# Patient Record
Sex: Female | Born: 1960 | Race: Black or African American | Hispanic: No | Marital: Married | Smoking: Never smoker
Health system: Southern US, Community
[De-identification: ages and names within clinical notes are randomized; demographics above are authoritative.]

## PROBLEM LIST (undated history)

## (undated) DIAGNOSIS — E079 Disorder of thyroid, unspecified: Secondary | ICD-10-CM

## (undated) DIAGNOSIS — C50919 Malignant neoplasm of unspecified site of unspecified female breast: Secondary | ICD-10-CM

## (undated) DIAGNOSIS — E119 Type 2 diabetes mellitus without complications: Secondary | ICD-10-CM

## (undated) DIAGNOSIS — I10 Essential (primary) hypertension: Secondary | ICD-10-CM

## (undated) HISTORY — DX: Type 2 diabetes mellitus without complications: E11.9

## (undated) HISTORY — PX: THYROIDECTOMY, PARTIAL: SHX18

## (undated) HISTORY — PX: TUBAL LIGATION: SHX77

## (undated) HISTORY — DX: Malignant neoplasm of unspecified site of unspecified female breast: C50.919

## (undated) HISTORY — DX: Disorder of thyroid, unspecified: E07.9

## (undated) HISTORY — PX: APPENDECTOMY: SHX54

## (undated) HISTORY — DX: Essential (primary) hypertension: I10

---

## 2009-11-25 ENCOUNTER — Ambulatory Visit (HOSPITAL_COMMUNITY): Admission: RE | Admit: 2009-11-25 | Discharge: 2009-11-25 | Payer: Self-pay | Admitting: Internal Medicine

## 2010-03-10 ENCOUNTER — Encounter (HOSPITAL_COMMUNITY): Admission: RE | Admit: 2010-03-10 | Discharge: 2010-03-10 | Payer: Self-pay | Admitting: Internal Medicine

## 2012-04-06 ENCOUNTER — Ambulatory Visit (INDEPENDENT_AMBULATORY_CARE_PROVIDER_SITE_OTHER): Payer: BC Managed Care – PPO | Admitting: Family Medicine

## 2012-04-06 VITALS — BP 155/96 | HR 81 | Temp 98.2°F | Resp 18 | Ht 66.5 in | Wt 264.8 lb

## 2012-04-06 DIAGNOSIS — L0291 Cutaneous abscess, unspecified: Secondary | ICD-10-CM

## 2012-04-06 DIAGNOSIS — L0390 Cellulitis, unspecified: Secondary | ICD-10-CM

## 2012-04-06 DIAGNOSIS — I10 Essential (primary) hypertension: Secondary | ICD-10-CM

## 2012-04-06 MED ORDER — FLUCONAZOLE 150 MG PO TABS: 150.0000 mg | ORAL_TABLET | Freq: Once | ORAL | Status: AC

## 2012-04-06 MED ORDER — CEPHALEXIN 500 MG PO CAPS: 1000.0000 mg | ORAL_CAPSULE | Freq: Two times a day (BID) | ORAL | Status: AC

## 2012-04-06 MED ORDER — LISINOPRIL 10 MG PO TABS: 10.0000 mg | ORAL_TABLET | Freq: Every day | ORAL | Status: DC

## 2012-04-06 NOTE — Progress Notes (Signed)
  Patient Name: Frances Cunningham Date of Birth: [birth year] Medical Record Number: [id number] Gender: female Date of Encounter: 04/06/2012  History of Present Illness:  Frances Cunningham is a 51 y.o. very pleasant female patient who presents with the following:  She had noted some left ear canal pain and swelling for several months This morning is worse- her hearing is decreased and swelling is increased.  She also felt some sort of fluid coming from her ear which concerned her.  No ST, no nasal symptoms, fever or cough  She also has been off of her lisinopril for about 6 months Her home BP is usually about 124/84 at home even without her medication.  However, her readings at clinic are consistently in the hypertensive range There is no problem list on file for this patient.  No past medical history on file. No past surgical history on file. History  Substance Use Topics  . Smoking status: Never Smoker   . Smokeless tobacco: Not on file  . Alcohol Use: Not on file   No family history on file. No Known Allergies  Medication list has been reviewed and updated.  Review of Systems: As per HPI- otherwise negative.   Physical Examination: Filed Vitals:   04/06/12 1029  BP: 155/96  Pulse: 81  Temp: 98.2 F (36.8 C)  TempSrc: Oral  Resp: 18  Height: 5' 6.5" (1.689 m)  Weight: 264 lb 12.8 oz (120.112 kg)  SpO2: 98%    Body mass index is 42.10 kg/(m^2).  GEN: WDWN, NAD, Non-toxic, A & O x 3, obese HEENT: Atraumatic, Normocephalic. Neck supple. No masses, No LAD.  Oropharynx wnl Ears and Nose: No external deformity. CV: RRR, No M/G/R. No JVD. No thrill. No extra heart sounds. PULM: CTA B, no wheezes, crackles, rhonchi. No retractions. No resp. distress. No accessory muscle use. EXTR: No c/c/e NEURO Normal gait.  PSYCH: Normally interactive. Conversant. Not depressed or anxious appearing.  Calm demeanor.  Right ear/ tm wnl.  Left ear: there is a pustule vs ruptured sebaceus cyst just  visible inside the ear canal.  It is already draining purulent matter.  Used a Q-tip and my finger to finish draining the area- got quite a bit of material out. TM and the remained of the IAC are normal   Assessment and Plan: 1. Abscess  cephALEXin (KEFLEX) 500 MG capsule, fluconazole (DIFLUCAN) 150 MG tablet  2. HTN (hypertension)  lisinopril (PRINIVIL,ZESTRIL) 10 MG tablet   Skin/ soft tissue infection inside left ear canal, pustule vs sebaceous.  Drained as above, use hot compresses to continue drainage.  Keflex as above Restart antihypertensives as I suspect her BP is elevated quite a bit of the time.  Patient (or parent if minor) instructed to return to clinic or call if not better in 2 day(s).  Sooner if worse.     She agreed to come in for a CPE this summer

## 2013-04-04 LAB — LIPID PANEL
Cholesterol: 198 mg/dL (ref 0–200)
HDL: 69 mg/dL (ref 35–70)
LDL Cholesterol: 111 mg/dL

## 2013-04-09 ENCOUNTER — Ambulatory Visit (INDEPENDENT_AMBULATORY_CARE_PROVIDER_SITE_OTHER): Payer: BC Managed Care – PPO | Admitting: Family Medicine

## 2013-04-09 VITALS — BP 128/80 | HR 74 | Temp 98.7°F | Resp 12 | Ht 67.5 in | Wt 266.0 lb

## 2013-04-09 DIAGNOSIS — E039 Hypothyroidism, unspecified: Secondary | ICD-10-CM

## 2013-04-09 DIAGNOSIS — N912 Amenorrhea, unspecified: Secondary | ICD-10-CM

## 2013-04-09 DIAGNOSIS — I10 Essential (primary) hypertension: Secondary | ICD-10-CM | POA: Insufficient documentation

## 2013-04-09 DIAGNOSIS — Z0000 Encounter for general adult medical examination without abnormal findings: Secondary | ICD-10-CM

## 2013-04-09 LAB — COMPREHENSIVE METABOLIC PANEL
ALT: 15 U/L (ref 0–35)
Albumin: 4.1 g/dL (ref 3.5–5.2)
Alkaline Phosphatase: 84 U/L (ref 39–117)
CO2: 24 mEq/L (ref 19–32)
Chloride: 108 mEq/L (ref 96–112)
Creat: 1.08 mg/dL (ref 0.50–1.10)
Potassium: 4 mEq/L (ref 3.5–5.3)
Total Bilirubin: 0.6 mg/dL (ref 0.3–1.2)

## 2013-04-09 LAB — POCT GLYCOSYLATED HEMOGLOBIN (HGB A1C): Hemoglobin A1C: 6

## 2013-04-09 MED ORDER — LISINOPRIL 10 MG PO TABS: 10.0000 mg | ORAL_TABLET | Freq: Every day | ORAL | Status: DC

## 2013-04-09 NOTE — Patient Instructions (Addendum)
You do need to schedule a mammogram as soon as possible- please let me know if you have any difficulty with this.    You do have pre- diabetes on the basis of today's A1c test- the cut- off for diagnosis of diabetes is an A1c of 6.5%.    Please work on diet and weight loss, and let's plan to recheck in about 3 months.   Please refer to the ADA website for lots of good diet/ exercise tips.    It is time to schedule a colonoscopy.  Please do this at your convenience.   You can use [facility], [facility] or [facility] (office of Dr. Elnoria Howard [address]).

## 2013-04-09 NOTE — Progress Notes (Signed)
[facility] [address], [place] [address] [phone]  Date:  04/09/2013   Name:  Frances Cunningham   DOB:  Sep 21, 1961   MRN:  [id number]  PCP:  No primary provider on file.    Chief Complaint: Gynecologic Exam   History of Present Illness:  Frances Cunningham is a 52 y.o. very pleasant female patient who presents with the following:  Here today for a CPE. History of HTN and hypothyroidism. She sees Dr. Sharl Ma who manages her thyroid condition She would like a pap today- uncertain of the date of her last pap, but no history of abnormal paps. No period since November of 2013.  Believes she is going through menopause.  She is s/p BTL She has not yet had a mammogram Not yet had a colonoscopy- her insurance company has contacted her about this and she does plan to schedule this test.     She had some labs taken at a health fair last week- her A1c was 6.6%.  She was noted to have a total CHL of 198, LDL of 111, HDL of 69.    She is fasting today so far.   She had a tetanus shot performed elsewhere in the last 2 or 3 years.   She did have gestational diabetes mellitus during pregnancy   There are no active problems to display for this patient.   Past Medical History  Diagnosis Date  . Thyroid disease   . Hypertension     Past Surgical History  Procedure Laterality Date  . Appendectomy    . Cesarean section    . Tubal ligation      History  Substance Use Topics  . Smoking status: Never Smoker   . Smokeless tobacco: Not on file  . Alcohol Use: No    No family history on file.  No Known Allergies  Medication list has been reviewed and updated.  Current Outpatient Prescriptions on File Prior to Visit  Medication Sig Dispense Refill  . levothyroxine (SYNTHROID, LEVOTHROID) 112 MCG tablet Take 112 mcg by mouth daily.      Marland Kitchen lisinopril (PRINIVIL,ZESTRIL) 10 MG tablet Take 1 tablet (10 mg total) by mouth daily.  90 tablet  3   No current  facility-administered medications on file prior to visit.    Review of Systems:  As per HPI- otherwise negative.   Physical Examination: Filed Vitals:   04/09/13 1055  BP: 128/80  Pulse: 74  Temp: 98.7 F (37.1 C)  Resp: 12   Filed Vitals:   04/09/13 1055  Height: 5' 7.5" (1.715 m)  Weight: 266 lb (120.657 kg)   Body mass index is 41.02 kg/(m^2). Ideal Body Weight: Weight in (lb) to have BMI = 25: 161.7  GEN: WDWN, NAD, Non-toxic, A & O x 3, obese HEENT: Atraumatic, Normocephalic. Neck supple. No masses, No LAD.  Bilateral TM wnl, oropharynx normal.  PEERL,EOMI.   Ears and Nose: No external deformity. CV: RRR, No M/G/R. No JVD. No thrill. No extra heart sounds. PULM: CTA B, no wheezes, crackles, rhonchi. No retractions. No resp. distress. No accessory muscle use. ABD: S, NT, ND, +BS. No rebound. No HSM. EXTR: No c/c/e NEURO Normal gait.  PSYCH: Normally interactive. Conversant. Not depressed or anxious appearing.  Calm demeanor.  Breast exam; normal, no masses, dimpling or discharge GU: normal exam, no adnexal masses or CMT.     Results for orders placed in visit on 04/09/13  POCT GLYCOSYLATED HEMOGLOBIN (HGB A1C)  Result Value Range   Hemoglobin A1C 6.0       Assessment and Plan: Physical exam, annual - Plan: Comprehensive metabolic panel, POCT glycosylated hemoglobin (Hb A1C), Pap IG w/ reflex to HPV when ASC-U  HTN (hypertension) - Plan: lisinopril (PRINIVIL,ZESTRIL) 10 MG tablet  Morbid obesity  Essential hypertension, benign  Unspecified hypothyroidism  Amenorrhea - Plan: Follicle Stimulating Hormone, hCG, serum, qualitative, CANCELED: POCT urine pregnancy   Repeated her A1c and it was in the pre- diabetes range today.  Discussed this with her in detail.  Recommended that she work on diet and exercise changes, and plan to recheck her A1c in about 3 months.  She is in danger of developing diabetes  Continue lisinopril for BP control.   Will check  FSH to evaluate for menopause.    Will plan further follow- up pending labs.     Signed Abbe Amsterdam, MD

## 2013-04-10 LAB — PAP IG W/ RFLX HPV ASCU

## 2013-04-10 LAB — HCG, SERUM, QUALITATIVE: Preg, Serum: NEGATIVE

## 2013-04-12 ENCOUNTER — Encounter: Payer: Self-pay | Admitting: Family Medicine

## 2013-04-12 ENCOUNTER — Telehealth: Payer: Self-pay | Admitting: Family Medicine

## 2013-04-12 NOTE — Telephone Encounter (Signed)
Have called several times to discuss with her- no answer.  Will send letter

## 2013-06-04 ENCOUNTER — Encounter: Payer: Self-pay | Admitting: *Deleted

## 2014-06-16 ENCOUNTER — Other Ambulatory Visit: Payer: Self-pay | Admitting: Internal Medicine

## 2014-06-16 DIAGNOSIS — R221 Localized swelling, mass and lump, neck: Secondary | ICD-10-CM

## 2014-06-16 DIAGNOSIS — E0500 Thyrotoxicosis with diffuse goiter without thyrotoxic crisis or storm: Secondary | ICD-10-CM

## 2014-06-17 ENCOUNTER — Ambulatory Visit
Admission: RE | Admit: 2014-06-17 | Discharge: 2014-06-17 | Disposition: A | Discharge status: Home / Self Care | Payer: BC Managed Care – PPO | Source: Ambulatory Visit | Attending: Internal Medicine | Admitting: Internal Medicine

## 2014-06-17 DIAGNOSIS — R221 Localized swelling, mass and lump, neck: Secondary | ICD-10-CM

## 2014-06-17 DIAGNOSIS — E0500 Thyrotoxicosis with diffuse goiter without thyrotoxic crisis or storm: Secondary | ICD-10-CM

## 2014-06-18 ENCOUNTER — Encounter: Payer: Self-pay | Admitting: Family Medicine

## 2014-06-18 DIAGNOSIS — E890 Postprocedural hypothyroidism: Secondary | ICD-10-CM | POA: Insufficient documentation

## 2014-06-18 DIAGNOSIS — E0500 Thyrotoxicosis with diffuse goiter without thyrotoxic crisis or storm: Secondary | ICD-10-CM | POA: Insufficient documentation

## 2015-05-26 ENCOUNTER — Ambulatory Visit (INDEPENDENT_AMBULATORY_CARE_PROVIDER_SITE_OTHER): Payer: BLUE CROSS/BLUE SHIELD | Admitting: Family Medicine

## 2015-05-26 VITALS — BP 126/86 | HR 75 | Temp 98.2°F | Resp 16 | Ht 67.5 in | Wt 263.2 lb

## 2015-05-26 DIAGNOSIS — S29009A Unspecified injury of muscle and tendon of unspecified wall of thorax, initial encounter: Secondary | ICD-10-CM

## 2015-05-26 DIAGNOSIS — D179 Benign lipomatous neoplasm, unspecified: Secondary | ICD-10-CM

## 2015-05-26 DIAGNOSIS — S29019A Strain of muscle and tendon of unspecified wall of thorax, initial encounter: Secondary | ICD-10-CM

## 2015-05-26 MED ORDER — CYCLOBENZAPRINE HCL 10 MG PO TABS: 10.0000 mg | ORAL_TABLET | Freq: Two times a day (BID) | ORAL | Status: DC | PRN

## 2015-05-26 MED ORDER — MELOXICAM 15 MG PO TABS
15.0000 mg | ORAL_TABLET | Freq: Every day | ORAL | Status: DC
Start: 2015-05-26 — End: 2015-07-06

## 2015-05-26 NOTE — Patient Instructions (Signed)
You appear to have a muscle strain of your thoracic back.  Use the mobic daily as needed (do not use ibuprofen also) You can use the flexeril as needed for spasm- this medication will make you sleepy however Continue heat and gentle exercise Let me know if you are not better in one week

## 2015-05-26 NOTE — Progress Notes (Signed)
[facility] [address], [place] [address] [phone]  Date:  05/26/2015   Name:  Frances Cunningham   DOB:  Oct 01, 1961   MRN:  [id number]  PCP:  No primary care provider on file.    Chief Complaint: Back Injury   History of Present Illness:  Frances Cunningham is a 54 y.o. very pleasant female patient who presents with the following:  Here today to discuss a back problem.  She has had some issues with her back for some time. She has right sided back pain that starts in her neck, down her right shoulder and into her right mid back down to the hip.   She has tired heat, hot soaks.  She has also tried advil, ibuprofen No radiation in her arms  She has had these sx for about 3 weeks. She is not aware of any injury in particular. Insidious onset.   She has a little bit of discomfort in her right leg and knee, but this is not new. Due to OA of the knee  Never had any back surgery.  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 Graves disease 06/18/2014  . Morbid obesity 04/09/2013  . Essential hypertension, benign 04/09/2013  . Unspecified hypothyroidism 04/09/2013    Past Medical History  Diagnosis Date  . Thyroid disease   . Hypertension     Past Surgical History  Procedure Laterality Date  . Appendectomy    . Cesarean section    . Tubal ligation      History  Substance Use Topics  . Smoking status: Never Smoker   . Smokeless tobacco: Not on file  . Alcohol Use: No    History reviewed. No pertinent family history.  No Known Allergies  Medication list has been reviewed and updated.  Current Outpatient Prescriptions on File Prior to Visit  Medication Sig Dispense Refill  . levothyroxine (SYNTHROID, LEVOTHROID) 112 MCG tablet Take 112 mcg by mouth  daily.    Marland Kitchen lisinopril (PRINIVIL,ZESTRIL) 10 MG tablet Take 1 tablet (10 mg total) by mouth daily. 90 tablet 3   No current facility-administered medications on file prior to visit.    Review of Systems:  As per HPI- otherwise negative.   Physical Examination: Filed Vitals:   05/26/15 1026  BP: 126/86  Pulse: 75  Temp: 98.2 F (36.8 C)  Resp: 16   Filed Vitals:   05/26/15 1026  Height: 5' 7.5" (1.715 m)  Weight: 263 lb 3.2 oz (119.387 kg)   Body mass index is 40.59 kg/(m^2). Ideal Body Weight: Weight in (lb) to have BMI = 25: 161.7  GEN: WDWN, NAD, Non-toxic, A & O x 3, obese, looks well HEENT: Atraumatic, Normocephalic. Neck supple. No masses, No LAD.  Thyroidectomy scar Ears and Nose: No external deformity. CV: RRR, No M/G/R. No JVD. No thrill. No extra heart sounds. PULM: CTA B, no wheezes, crackles, rhonchi. No retractions. No resp. distress. No accessory muscle use. ABD: S, NT, ND EXTR: No c/c/e NEURO Normal gait.  PSYCH: Normally interactive. Conversant. Not depressed or anxious appearing.  Calm demeanor.  Tenderness and spasm in her right sided thoracic muscles.  Normal strength, sensation and DTR. Negative SLR bilateally Normal ROM of her neck and right shoulder She notes  a small fatty area on her right neck- this has been [place] by another provider and told it was a lipoma. She is not happy with the appearance of this area and would consider surgical removal  Assessment and Plan: Thoracic myofascial strain, initial encounter - Plan: meloxicam (MOBIC) 15 MG tablet, cyclobenzaprine (FLEXERIL) 10 MG tablet  Lipoma - Plan: Ambulatory referral to General Surgery  Back strain- treat with mobic and flexeril as needed Restricted duty for a few days Referral to general surgery    Signed Lamar Blinks, MD

## 2015-07-06 ENCOUNTER — Ambulatory Visit (INDEPENDENT_AMBULATORY_CARE_PROVIDER_SITE_OTHER): Payer: BLUE CROSS/BLUE SHIELD | Admitting: Family Medicine

## 2015-07-06 VITALS — BP 130/82 | HR 91 | Temp 98.3°F | Resp 18 | Ht 67.0 in | Wt 262.0 lb

## 2015-07-06 DIAGNOSIS — S29009A Unspecified injury of muscle and tendon of unspecified wall of thorax, initial encounter: Secondary | ICD-10-CM

## 2015-07-06 DIAGNOSIS — B379 Candidiasis, unspecified: Secondary | ICD-10-CM | POA: Diagnosis not present

## 2015-07-06 DIAGNOSIS — S29019A Strain of muscle and tendon of unspecified wall of thorax, initial encounter: Secondary | ICD-10-CM

## 2015-07-06 MED ORDER — CYCLOBENZAPRINE HCL 10 MG PO TABS: 10.0000 mg | ORAL_TABLET | Freq: Two times a day (BID) | ORAL | Status: DC | PRN

## 2015-07-06 MED ORDER — FLUCONAZOLE 150 MG PO TABS
150.0000 mg | ORAL_TABLET | Freq: Once | ORAL | Status: DC
Start: 2015-07-06 — End: 2015-10-16

## 2015-07-06 MED ORDER — MELOXICAM 15 MG PO TABS: 15.0000 mg | ORAL_TABLET | Freq: Every day | ORAL | Status: DC

## 2015-07-06 NOTE — Progress Notes (Signed)
[facility] [address], [place] [address] [phone]  Date:  07/06/2015   Name:  Frances Cunningham   DOB:  26-Jun-1961   MRN:  [id number]  PCP:  No primary care provider on file.    Chief Complaint: Back Pain   History of Present Illness:  Frances Cunningham is a 55 y.o. very pleasant female patient who presents with the following:  Seen by myself in June for back pain.  Treated with mobic and flexeril.   She notes that her back does seem to be improved. She is better able to rotate her neck while driving, etc. Overall she feels much better.  She thinks that she may have a yeast infection.  She notes some vaginal itching and thick white discharge- seems like yeast to her.  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 Graves disease 06/18/2014  . Morbid obesity 04/09/2013  . Essential hypertension, benign 04/09/2013  . Unspecified hypothyroidism 04/09/2013    Past Medical History  Diagnosis Date  . Thyroid disease   . Hypertension     Past Surgical History  Procedure Laterality Date  . Appendectomy    . Cesarean section    . Tubal ligation      History  Substance Use Topics  . Smoking status: Never Smoker   . Smokeless tobacco: Not on file  . Alcohol Use: No    History reviewed. No pertinent family history.  No Known Allergies  Medication list has been reviewed and updated.  Current Outpatient Prescriptions on File Prior to Visit  Medication Sig Dispense Refill  . cyclobenzaprine (FLEXERIL) 10 MG tablet Take 1 tablet (10 mg total) by mouth 2 (two) times daily as needed for muscle spasms. 30 tablet 0  . levothyroxine (SYNTHROID,  LEVOTHROID) 112 MCG tablet Take 112 mcg by mouth daily.    . meloxicam (MOBIC) 15 MG tablet Take 1 tablet (15 mg total) by mouth daily. Use as needed for back pain 30 tablet 0  . lisinopril (PRINIVIL,ZESTRIL) 10 MG tablet Take 1 tablet (10 mg total) by mouth daily. 90 tablet 3   No current facility-administered medications on file prior to visit.    Review of Systems:  As per HPI- otherwise negative.   Physical Examination: Filed Vitals:   07/06/15 1354  BP: 130/82  Pulse: 91  Temp: 98.3 F (36.8 C)  Resp: 18   Filed Vitals:   07/06/15 1354  Height: 5\' 7"  (1.702 m)  Weight: 262 lb (118.842 kg)   Body mass index is 41.03 kg/(m^2). Ideal Body Weight: Weight in (lb) to have BMI = 25: 159.3  GEN: WDWN, NAD, Non-toxic, A & O x 3, overweight, looks well HEENT: Atraumatic, Normocephalic. Neck supple. No masses, No LAD. Ears and Nose: No external deformity. CV: RRR, No M/G/R. No JVD. No thrill. No extra heart sounds. PULM: CTA B, no wheezes, crackles, rhonchi. No retractions. No resp. distress. No accessory muscle use. EXTR: No c/c/e NEURO Normal gait.  PSYCH: Normally interactive. Conversant. Not depressed or anxious appearing.  Calm demeanor.  Tenderness of her right sided thoracic muscles seems resolved, normal strength of BUE  Assessment and Plan: Thoracic myofascial strain, initial encounter - Plan: meloxicam (MOBIC) 15 MG tablet, cyclobenzaprine (FLEXERIL) 10 MG tablet  Monilia infection - Plan: fluconazole (DIFLUCAN) 150 MG tablet  Cleared to return to full duty on 8/12, which is when the 6 weeks of modified duty she was given runs out Refilled flexeril and mobic to use as needed Treat for likely yeast vaginitis with diflucan- she will let me know if not better  Signed Lamar Blinks, MD

## 2015-07-06 NOTE — Patient Instructions (Signed)
Good to see you today- use the mobic and flexeril just as needed Let me know if your yeast infection does not clear up with the diflucan pill Also let me know if your back does not continue to feel better

## 2015-08-07 ENCOUNTER — Ambulatory Visit: Payer: Self-pay | Admitting: Surgery

## 2015-08-07 NOTE — H&P (Signed)
Female History of Present Illness Frances Cunningham. Frances Larose MD; 08/07/2015 3:43 PM) The patient is a 54 year old female who presents with a complaint of mass. Referred by Dr. Janett Billow Copland for evaluation of right neck mass. This area has been presents for a couple of years but seems to be enlarging. It is mildly tender. It has never become infected or had any drainage. No erythema. She mentioned this to her doctor who obtained an ultrasound. She presents for evaluation for removal.    CLINICAL DATA: Palpable mass of right mid lateral neck. Status post prior partial thyroidectomy and I 131 treatment for Graves disease.  EXAM: ULTRASOUND OF HEAD/NECK SOFT TISSUES  TECHNIQUE: Ultrasound examination of the head and neck soft tissues was performed in the area of clinical concern.  COMPARISON: None.  FINDINGS: Sonographic evaluation in the region directed by the patient shows some vague generalized thickening of subcutaneous fatty tissue in the region of palpable abnormality. This is quite nonspecific in appearance without a discrete solid mass or fluid collection identified. No enlarged lymph nodes are seen.  IMPRESSION: Ultrasound in the region of palpable abnormality shows suggestion of thickened subcutaneous fatty tissue. No focal solid mass or fluid collection is identified.   Electronically Signed By: Aletta Edouard M.D. On: 06/17/2014 14:31 Other Problems Frances Cunningham, CMA; 08/07/2015 10:13 AM) Back Pain Hemorrhoids High blood pressure Thyroid Disease  Past Surgical History Frances Cunningham, CMA; 08/07/2015 10:13 AM) Appendectomy Cesarean Section - Multiple Thyroid Surgery  Diagnostic Studies History Frances Cunningham, CMA; 08/07/2015 10:13 AM) Colonoscopy never Mammogram never Pap Smear 1-5 years ago  Allergies Frances Cunningham, CMA; 08/07/2015 10:14 AM) No Known Drug Allergies 08/07/2015  Medication History Frances Cunningham, CMA; 08/07/2015 10:14 AM) Cyclobenzaprine HCl  (10MG  Tablet, Oral) Active. Fluconazole (150MG  Tablet, Oral) Active. Levothyroxine Sodium (137MCG Tablet, Oral) Active. Meloxicam (15MG  Tablet, Oral) Active. Lisinopril (10MG  Tablet, Oral) Active. Medications Reconciled  Social History Frances Cunningham, [place]; 08/07/2015 10:13 AM) Caffeine use Carbonated beverages. No alcohol use No drug use Tobacco use Never smoker.  Family History Frances Cunningham, [place]; 08/07/2015 10:13 AM) Arthritis Mother. Hypertension Mother.  Pregnancy / Birth History Frances Cunningham, CMA; 08/07/2015 10:13 AM) Age at menarche 58 years. Contraceptive History Oral contraceptives. Gravida 5 Irregular periods Maternal age 34-35 Para 3     Review of Systems Frances Cunningham CMA; 08/07/2015 10:14 AM) General Not Present- Appetite Loss, Chills, Fatigue, Fever, Night Sweats, Weight Gain and Weight Loss. Skin Not Present- Change in Wart/Mole, Dryness, Hives, Jaundice, New Lesions, Non-Healing Wounds, Rash and Ulcer. HEENT Present- Wears glasses/contact lenses. Not Present- Earache, Hearing Loss, Hoarseness, Nose Bleed, Oral Ulcers, Ringing in the Ears, Seasonal Allergies, Sinus Pain, Sore Throat, Visual Disturbances and Yellow Eyes. Respiratory Not Present- Bloody sputum, Chronic Cough, Difficulty Breathing, Snoring and Wheezing. Breast Not Present- Breast Mass, Breast Pain, Nipple Discharge and Skin Changes. Cardiovascular Not Present- Chest Pain, Difficulty Breathing Lying Down, Leg Cramps, Palpitations, Rapid Heart Rate, Shortness of Breath and Swelling of Extremities. Gastrointestinal Present- Hemorrhoids. Not Present- Abdominal Pain, Bloating, Bloody Stool, Change in Bowel Habits, Chronic diarrhea, Constipation, Difficulty Swallowing, Excessive gas, Gets full quickly at meals, Indigestion, Nausea, Rectal Pain and Vomiting. Female Genitourinary Not Present- Frequency, Nocturia, Painful Urination, Pelvic Pain and Urgency. Musculoskeletal Present- Back Pain, Joint Pain  and Joint Stiffness. Not Present- Muscle Pain, Muscle Weakness and Swelling of Extremities. Neurological Not Present- Decreased Memory, Fainting, Headaches, Numbness, Seizures, Tingling, Tremor, Trouble walking and Weakness. Psychiatric Present- Change in Sleep Pattern. Not Present- Anxiety, Bipolar, Depression, Fearful  and Frequent crying. Endocrine Present- Hot flashes. Not Present- Cold Intolerance, Excessive Hunger, Hair Changes, Heat Intolerance and New Diabetes.  Vitals Frances Cunningham CMA; 08/07/2015 10:15 AM) 08/07/2015 10:14 AM Weight: 263 lb Height: 68in Body Surface Area: 2.39 m Body Mass Index: 39.99 kg/m Temp.: 97.35F(Temporal)  Pulse: 94 (Regular)  BP: 128/78 (Sitting, Left Arm, Standard)     Physical Exam Rodman Key K. Shamera Yarberry MD; 08/07/2015 3:43 PM)  The physical exam findings are as follows: Note:WDWN in NAD HEENT: EOMI, sclera anicteric Neck: right anterolateral neck - smooth, mobile 3 cm subcutaneous mass; no overlying skin changes; minimally tender Lungs: CTA bilaterally; normal respiratory effort CV: Regular rate and rhythm; no murmurs Abd: +bowel sounds, soft, non-tender, no masses Ext: Well-perfused; no edema Skin: Warm, dry; no sign of jaundice    Assessment & Plan Rodman Key K. 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 The surgical procedure has been discussed with the patient. Potential risks, benefits, alternative treatments, and expected outcomes have been explained. All of the patient's questions at this time have been answered. The likelihood of reaching the patient's treatment goal is good. The patient understand the proposed surgical procedure and wishes to proceed.  Frances Cunningham. [name], MD, [facility]  General/ Trauma Surgery  08/07/2015 3:44 PM

## 2015-08-21 ENCOUNTER — Other Ambulatory Visit: Payer: Self-pay | Admitting: Family Medicine

## 2015-08-22 NOTE — Telephone Encounter (Signed)
LM  Is patient still having back pain?

## 2015-08-23 ENCOUNTER — Telehealth: Payer: Self-pay

## 2015-08-23 DIAGNOSIS — S29019A Strain of muscle and tendon of unspecified wall of thorax, initial encounter: Secondary | ICD-10-CM

## 2015-08-23 NOTE — Telephone Encounter (Signed)
Pt would like a refill on her meloxicam (MOBIC) 15 MG tablet [[id number]]. Please advise

## 2015-08-24 MED ORDER — MELOXICAM 15 MG PO TABS: 15.0000 mg | ORAL_TABLET | Freq: Every day | ORAL | Status: DC

## 2015-08-24 NOTE — Telephone Encounter (Signed)
Left message Rx was sent in

## 2015-08-24 NOTE — Telephone Encounter (Signed)
Pt called back to check on status, I will forward to PA pool as well to review in Dr Copland's absence.

## 2015-08-24 NOTE — Telephone Encounter (Signed)
Dr Lorelei Pont, do you want to RF? You have written for pt a couple of times, but never w/RFs.

## 2015-08-24 NOTE — Telephone Encounter (Signed)
Meds ordered this encounter  Medications  . meloxicam (MOBIC) 15 MG tablet    Sig: Take 1 tablet (15 mg total) by mouth daily. Use as needed for back pain    Dispense:  30 tablet    Refill:  0    Order Specific Question:  Supervising Provider    Answer:  DOOLITTLE, ROBERT P [[id number]]

## 2015-08-24 NOTE — Telephone Encounter (Signed)
Dr. Lorelei Pont, is this medication to be used for short term? Please advise on refill.

## 2015-08-24 NOTE — Telephone Encounter (Signed)
See also ph message routed to Dr Lorelei Pont.

## 2015-10-16 ENCOUNTER — Ambulatory Visit (INDEPENDENT_AMBULATORY_CARE_PROVIDER_SITE_OTHER): Payer: BLUE CROSS/BLUE SHIELD

## 2015-10-16 ENCOUNTER — Ambulatory Visit (INDEPENDENT_AMBULATORY_CARE_PROVIDER_SITE_OTHER): Payer: BLUE CROSS/BLUE SHIELD | Admitting: Physician Assistant

## 2015-10-16 VITALS — BP 124/74 | HR 98 | Temp 98.4°F | Resp 17 | Ht 66.5 in | Wt 267.0 lb

## 2015-10-16 DIAGNOSIS — M62830 Muscle spasm of back: Secondary | ICD-10-CM

## 2015-10-16 DIAGNOSIS — R0789 Other chest pain: Secondary | ICD-10-CM

## 2015-10-16 DIAGNOSIS — S29019A Strain of muscle and tendon of unspecified wall of thorax, initial encounter: Secondary | ICD-10-CM

## 2015-10-16 DIAGNOSIS — S29009A Unspecified injury of muscle and tendon of unspecified wall of thorax, initial encounter: Secondary | ICD-10-CM

## 2015-10-16 MED ORDER — METAXALONE 800 MG PO TABS: 800.0000 mg | ORAL_TABLET | Freq: Three times a day (TID) | ORAL | Status: DC | PRN

## 2015-10-16 MED ORDER — MELOXICAM 15 MG PO TABS: 15.0000 mg | ORAL_TABLET | Freq: Every day | ORAL | Status: DC

## 2015-10-16 NOTE — Progress Notes (Signed)
   Frances Cunningham  MRN: [id number] DOB: 08/08/1961  Subjective:  Pt presents to clinic with right upper back pain that is the same as when she was seen in 06/2015.  No pain radiation.  No numbness or weakness.  Movement of arm does not cause the pain - the pain is increase with torso rotation or coughing.  She is starting to worry because she is having some SOB not related to pain and some wheezing.  She has no current cold symptoms and feels fine other than her pain.  She has been using the mobic and that helps but she is unable to use the flexeril due to drowsiness.  NKI to the area. More of a flair of her pain from 06/2015 visit. Right handed. 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  Patient Active Problem List   Diagnosis Date Noted  . 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 Graves disease 06/18/2014  . Morbid obesity ([place]) 04/09/2013  . Essential hypertension, benign 04/09/2013    Current Outpatient Prescriptions on File Prior to Visit  Medication Sig Dispense Refill  . levothyroxine (SYNTHROID, LEVOTHROID) 112 MCG tablet Take 112 mcg by mouth daily.     No current facility-administered medications on file prior to visit.    No Known Allergies  Review of Systems  HENT: Negative for congestion.   Respiratory: Positive for shortness of breath (rare) and wheezing (rare). Negative for cough.   Musculoskeletal: Positive for back pain.   Objective:  BP 124/74 mmHg  Pulse 98  Temp(Src) 98.4 F (36.9 C) (Oral)  Resp 17  Ht 5' 6.5" (1.689 m)  Wt 267 lb (121.11 kg)  BMI 42.45 kg/m2  SpO2 99%  LMP 10/16/2015  Physical Exam  Constitutional: She is oriented to person, place, and time and well-developed, well-nourished, and in no distress.  HENT:  Head: Normocephalic and atraumatic.  Right Ear: Hearing and external ear normal.  Left Ear: Hearing and external ear normal.  Eyes: Conjunctivae are normal.  Neck: Normal range of motion.  Cardiovascular: Normal rate,  regular rhythm and normal heart sounds.   No murmur heard. Pulmonary/Chest: Effort normal and breath sounds normal. She has no wheezes.  Musculoskeletal:       Thoracic back: She exhibits tenderness. She exhibits normal range of motion and no bony tenderness.       Back:  Neurological: She is alert and oriented to person, place, and time. Gait normal.  Skin: Skin is warm and dry.  Psychiatric: Mood, memory, affect and judgment normal.  Vitals reviewed.  [facility] reading (PRIMARY) by  Dr. Everlene Farrier.  NAD  Assessment and Plan :  Chest wall pain - Plan: DG Chest 2 View  Thoracic myofascial strain, initial encounter - Plan: meloxicam (MOBIC) 15 MG tablet, metaxalone (SKELAXIN) 800 MG tablet  Paraspinal muscle spasm - Plan: Ambulatory referral to Physical Therapy  Continue Mobic, change to Skelaxin in hope that she will be able to tolerate the side effects and she will be able to take more often.  We will start PT. I expect these spasms to be related to her body mechanics.  Her lung exam is clear and there is no evidence of abnl of CXR - she will continue to monitor her breathing issues but since it is a rare occurrence we will let time tell.  D/w Dr Gillis Ends PA-C  [facility] 10/16/2015 10:54 AM

## 2015-10-16 NOTE — Patient Instructions (Signed)
We are going to do Physical therapy  Heat to the area

## 2015-10-21 ENCOUNTER — Telehealth: Payer: Self-pay | Admitting: Physician Assistant

## 2015-10-21 NOTE — Telephone Encounter (Signed)
Please advise patient that this letter (that has been faxed) should restrict her to light duty for 1 week.  This is a recommendation.  This appears to not be worker's comp.  Follow up will be needed by PT or [place], in continuing work restrictions.  When she is seen again, please advise the provider to write a note for you (if necessary).  We can not restrict someone from work duties for 6 weeks without proper follow up.

## 2015-10-21 NOTE — Telephone Encounter (Signed)
Judson Roch saw pt, can someone help with this?

## 2015-10-21 NOTE — Telephone Encounter (Signed)
Patient states that she needs an OWN putting her on light duty for 6 weeks if possible. Patient was seen on 10/16/2015 for chest pain and thoracic myofascial strain. She has been referred to PT. Patient is unsure when her PT will start. Please advise. If letter can be written please fax it to [phone]. Please call patient at her work number; [phone] extension [phone]. Ask for [employer].   Thank you, Frances C.

## 2015-10-23 NOTE — Telephone Encounter (Signed)
Spoke with pt, advised message. Pt understood. 

## 2015-10-23 NOTE — Telephone Encounter (Signed)
Left message for pt to call back  °

## 2015-10-26 ENCOUNTER — Telehealth: Payer: Self-pay

## 2015-10-26 NOTE — Telephone Encounter (Signed)
I am not sure what the patient is wanting this for.  Is it for light duty? Because she does not need to be out of work.

## 2015-10-26 NOTE — Telephone Encounter (Signed)
Patient brought in FMLA forms to be completed by Windell Hummingbird, I have filled out what I could from the [place] notes and highlighted the areas that need to be completed. Please fill out and return to the FMLA/Disability box at the [address] Checkout desk within 5-7 business days. I will place in your box on 10/26/15. Thank you!

## 2015-10-28 DIAGNOSIS — Z0271 Encounter for disability determination: Secondary | ICD-10-CM

## 2015-10-28 NOTE — Telephone Encounter (Signed)
Yes it is to cover her light duty and any time that she was written out for following your appointment or the one she had with Dr Lorelei Pont in June. If you have any more questions just let me know and if I need to call the patient I can.

## 2015-10-30 NOTE — Telephone Encounter (Signed)
FMLA paperwork is finished

## 2015-10-30 NOTE — Telephone Encounter (Signed)
FMLA forms scanned and faxed over on 10/30/15.

## 2015-11-30 ENCOUNTER — Ambulatory Visit (INDEPENDENT_AMBULATORY_CARE_PROVIDER_SITE_OTHER): Payer: BLUE CROSS/BLUE SHIELD | Admitting: Family Medicine

## 2015-11-30 VITALS — BP 155/98 | HR 89 | Temp 98.1°F | Resp 14 | Ht 67.0 in | Wt 271.4 lb

## 2015-11-30 DIAGNOSIS — M546 Pain in thoracic spine: Secondary | ICD-10-CM | POA: Diagnosis not present

## 2015-11-30 MED ORDER — TRAMADOL HCL 50 MG PO TABS: 50.0000 mg | ORAL_TABLET | Freq: Three times a day (TID) | ORAL | Status: DC | PRN

## 2015-11-30 NOTE — Patient Instructions (Signed)
I am going to refer you to Dr. Nelva Bush who is a physical medicine and rehabilitation specialist at [facility] orth[facility].  In the meantime try the tramadol as needed for pain- remember this is a mild narcotic and should be used only when necessary.  It may cause sedation so do not drive if you are sedated. Ok to use this with aleve/ advil and/ or tylenol

## 2015-11-30 NOTE — Progress Notes (Signed)
[facility] [address], [place] [address] [phone]  Date:  11/30/2015   Name:  Frances Cunningham   DOB:  Sep 12, 1961   MRN:  [id number]  PCP:  No primary care provider on file.    Chief Complaint: Back Pain   History of Present Illness:  Frances Cunningham is a 55 y.o. very pleasant female patient who presents with the following:  She is here today with back trouble.  We have seen her for this in the past.  She has been doing PT but does not seem to be getting much better.  PT does make her feel better for a while but pain always comes back.  We first saw her for this in June- at that time she has right sided back pain from the neck into the hip. The pain is now more localized under the right scapula.   In November we did a chest x-ray that was normal She is using advil that helps some.  Muscle relaxers do not help her at all but they do make her sleepy.  She is not really taking the skelaxin  There was no inciting injury as far as she can recall.  No falls, etc.    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  Needs me to complete some paperwork for her today.   The pain seems to be getting a bit worse overall.  She is getting worried about it.   If she will cough she has a lot of pain.    She has pain when she sleeps as she tends to sleep in her right side.  Sometimes she cannot sleep well due to the pain  Her Graves, etc is managed by Dr. Buddy Duty, her endocrinologist   Patient Active Problem List   Diagnosis Date Noted  . 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 Graves disease 06/18/2014  . Morbid obesity ([place]) 04/09/2013  . Essential hypertension, benign 04/09/2013    Past Medical History  Diagnosis Date  . Thyroid disease   . Hypertension     Past Surgical History  Procedure Laterality Date  . Appendectomy    . Cesarean section    . Tubal ligation      Social History  Substance Use Topics  . Smoking status: Never  Smoker   . Smokeless tobacco: None  . Alcohol Use: No    History reviewed. No pertinent family history.  No Known Allergies  Medication list has been reviewed and updated.  Current Outpatient Prescriptions on File Prior to Visit  Medication Sig Dispense Refill  . levothyroxine (SYNTHROID, LEVOTHROID) 112 MCG tablet Take 112 mcg by mouth daily.    . meloxicam (MOBIC) 15 MG tablet Take 1 tablet (15 mg total) by mouth daily. Use as needed for back pain 90 tablet 0  . metaxalone (SKELAXIN) 800 MG tablet Take 1 tablet (800 mg total) by mouth 3 (three) times daily as needed for muscle spasms. 60 tablet 0   No current facility-administered medications on file prior to visit.    Review of Systems:  As per HPI- otherwise negative.   Physical Examination: Filed Vitals:   11/30/15 1623  BP: 174/96  Pulse: 89  Temp: 98.1 F (36.7 C)  Resp: 14   Filed Vitals:   11/30/15 1623  Height: 5\' 7"  (1.702 m)  Weight: 271 lb 6.4 oz (123.106 kg)   Body mass index is 42.5 kg/(m^2). Ideal Body Weight: Weight in (  lb) to have BMI = 25: 159.3  GEN: WDWN, NAD, Non-toxic, A & O x 3, obese, looks well HEENT: Atraumatic, Normocephalic. Neck supple. No masses, No LAD.  Bilateral TM wnl, oropharynx normal.  PEERL,EOMI.   Ears and Nose: No external deformity. CV: RRR, No M/G/R. No JVD. No thrill. No extra heart sounds. PULM: CTA B, no wheezes, crackles, rhonchi. No retractions. No resp. distress. No accessory muscle use. She notes tenderness under the right shoulder blade that is reproducible on palpation. No redness, lesion or swelling. Her spinal flexion and extension is reduced.  No pain with movement or strength testing of the right arm.   EXTR: No c/c/e NEURO Normal gait.  PSYCH: Normally interactive. Conversant. Not depressed or anxious appearing.  Calm demeanor.    Assessment and Plan: Right-sided thoracic back pain - Plan: traMADol (ULTRAM) 50 MG tablet, Ambulatory referral to Physical  Medicine Rehab  Completed paperwork for her job today outlining her restrictions and will fax in for her Refer to [facility] for their opinion Will try tramadol for when her pain is more severe   She notes that her BP can vary some with her Graves disease but she will monitor it and make sure it is generally under control   Signed Lamar Blinks, MD

## 2016-01-26 ENCOUNTER — Telehealth: Payer: Self-pay

## 2016-01-26 NOTE — Telephone Encounter (Signed)
Pt was referred to ortho - they should fill out these forms.  Has she seen them?

## 2016-01-26 NOTE — Telephone Encounter (Signed)
Patient needs FMLA forms updated for the new year by Frances Cunningham, I have filled out what I could from the Frances Cunningham notes and from her last FMLA forms, please look over and fill out the highlighted areas. Return to the FMLA/Disability box at the [address] checkout desk within 5-7 business days. I will place these in your box on 01/26/16.Thank you!

## 2016-01-29 NOTE — Telephone Encounter (Signed)
[place], i have let the employer know and i will call the patient and let her know as well.

## 2016-02-04 ENCOUNTER — Telehealth: Payer: Self-pay

## 2016-02-04 NOTE — Telephone Encounter (Signed)
Patient will be coming in on Friday 02/05/16 to see Windell Hummingbird about her FMLA, I explained that we could not fill it out properly since we had not seen her since Jan 2017, she stated that her ortho doctor filled out the forms stating she was on no restrictions and that she was fine to work, and she states that is not true and that Dr Lorelei Pont had her out and had her on certain restrictions. I let her know Sarah's schedule and that she would have to be re-evaluated before we could complete the forms.  Do you still have these or do I need to print them off and put them back in your box?

## 2016-02-05 ENCOUNTER — Ambulatory Visit (INDEPENDENT_AMBULATORY_CARE_PROVIDER_SITE_OTHER): Payer: BLUE CROSS/BLUE SHIELD | Admitting: Physician Assistant

## 2016-02-05 VITALS — BP 124/90 | HR 92 | Temp 98.9°F | Resp 16 | Ht 66.5 in | Wt 268.6 lb

## 2016-02-05 DIAGNOSIS — M546 Pain in thoracic spine: Secondary | ICD-10-CM

## 2016-02-05 NOTE — Patient Instructions (Signed)
Call dr. Rayetta Humphrey about your FMLA paperwork. You can follow up with Windell Hummingbird for your primary care issues.

## 2016-02-05 NOTE — Progress Notes (Signed)
[facility] [address], [place] [address] [phone]  Date:  02/05/2016   Name:  Frances Cunningham   DOB:  Nov 04, 1961   MRN:  [id number]  PCP:  No primary care provider on file.    Chief Complaint: Back Pain   History of Present Illness:  This is a 55 y.o. female with PMH graves disease, HTN who is presenting with back pain x 9 months. Was first seen here 04/2015 by Dr. Lorelei Pont. Treated with flexeril and mobic. Returned 06/2015 and meds were refilled. She returned 09/2015 and saw PA Windell Hummingbird - was referred for PT at that time. Returned 11/2015 without much improvement in symptoms. Was referred to Dr. Nelva Bush who did an MRI and epidural 5 weeks ago. She feels her symptoms have improved greatly but are not yet resolved. She continues to do PT once a week. She was on light duty at work for the past 12 weeks. This period has ended. She is wanting FMLA filled out again. Apparently Dr. Nelva Bush said she was OK to go back to work full today. She works as a Designer, industrial/product. There is a phone message in her chart about this with Windell Hummingbird -- Judson Roch stated she would need to get this addressed with Dr. Nelva Bush.   Review of Systems:  Review of Systems See HPI  Patient Active Problem List   Diagnosis Date Noted  . 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 Graves disease 06/18/2014  . Morbid obesity ([place]) 04/09/2013  . Essential hypertension, benign 04/09/2013    Prior to Admission medications   Medication Sig Start Date End Date Taking? Authorizing Provider  levothyroxine (SYNTHROID, LEVOTHROID) 112 MCG tablet Take 112 mcg by mouth daily.   Yes Historical Provider, MD  traMADol (ULTRAM) 50 MG tablet Take 1 tablet (50 mg total) by mouth every 8 (eight) hours as needed. 11/30/15  Yes Gay Filler Copland, MD  meloxicam (MOBIC) 15 MG tablet Take 1 tablet (15 mg total) by mouth daily. Use as needed for back pain Patient not taking: Reported on 02/05/2016 10/16/15   Mancel Bale,  PA-C  metaxalone (SKELAXIN) 800 MG tablet Take 1 tablet (800 mg total) by mouth 3 (three) times daily as needed for muscle spasms. Patient not taking: Reported on 02/05/2016 10/16/15   Mancel Bale, PA-C    No Known Allergies  Past Surgical History  Procedure Laterality Date  . Appendectomy    . Cesarean section    . Tubal ligation      Social History  Substance Use Topics  . Smoking status: Never Smoker   . Smokeless tobacco: None  . Alcohol Use: No    History reviewed. No pertinent family history.  Medication list has been reviewed and updated.  Physical Examination:  Physical Exam  Constitutional: She is oriented to person, place, and time. She appears well-developed and well-nourished. No distress.  HENT:  Head: Normocephalic and atraumatic.  Right Ear: Hearing normal.  Left Ear: Hearing normal.  Nose: Nose normal.  Eyes: Conjunctivae and lids are normal. Right eye exhibits no discharge. Left eye exhibits no discharge. No scleral icterus.  Pulmonary/Chest: Effort normal. No respiratory distress.  Musculoskeletal: Normal range of motion.  Neurological: She is alert and oriented to person, place, and time.  Skin: Skin is warm, dry and intact. No lesion and no rash noted.  Psychiatric: She has a normal mood and affect. Her speech is normal and behavior is normal. Thought content normal.  BP 124/90 mmHg  Pulse 92  Temp(Src) 98.9 F (37.2 C) (Oral)  Resp 16  Ht 5' 6.5" (1.689 m)  Wt 268 lb 9.6 oz (121.836 kg)  BMI 42.71 kg/m2  SpO2 98%  LMP 01/12/2016  Assessment and Plan:  1. Right-sided thoracic back pain Not sure what the exact problem is with her back, but apparently warranted an epidural by Dr. Nelva Bush. Pain is improved but not resolved. Her 12 weeks of FMLA has ended and she is wanting an extension. I explained to patient that she is now under ortho care -- they are now in charge of her FMLA. If they said she is ok to go to back to work that we would have  to honor that. Follow up with ortho.   Benjaman Pott Drenda Freeze, MHS [facility]  02/10/2016

## 2016-02-05 NOTE — Telephone Encounter (Signed)
Please let the patient know that it is unlikely that I will go against the specialist opinion.  I will need to get notes/results from the specialist.

## 2016-12-08 ENCOUNTER — Encounter (HOSPITAL_COMMUNITY): Payer: Self-pay | Admitting: Emergency Medicine

## 2016-12-08 ENCOUNTER — Ambulatory Visit (HOSPITAL_COMMUNITY)
Admission: EM | Admit: 2016-12-08 | Discharge: 2016-12-08 | Disposition: A | Discharge status: Home / Self Care | Payer: BLUE CROSS/BLUE SHIELD | Attending: Emergency Medicine | Admitting: Emergency Medicine

## 2016-12-08 DIAGNOSIS — L0291 Cutaneous abscess, unspecified: Secondary | ICD-10-CM

## 2016-12-08 MED ORDER — LIDOCAINE-EPINEPHRINE (PF) 2 %-1:200000 IJ SOLN
INTRAMUSCULAR | Status: AC
  Filled 2016-12-08: qty 20

## 2016-12-08 MED ORDER — FLUCONAZOLE 150 MG PO TABS: ORAL_TABLET | ORAL | 0 refills | Status: DC

## 2016-12-08 MED ORDER — SULFAMETHOXAZOLE-TRIMETHOPRIM 800-160 MG PO TABS: 1.0000 | ORAL_TABLET | Freq: Two times a day (BID) | ORAL | 0 refills | Status: AC

## 2016-12-08 NOTE — ED Triage Notes (Signed)
Here for abscess on back of neck onset 7 days associated w/pain  Denies fevers, drainage  A&O x4... NAD

## 2016-12-08 NOTE — ED Provider Notes (Signed)
CSN: [id number]     Arrival date & time 12/08/16  1149 History   None    Chief Complaint  Patient presents with  . Abscess   (Consider location/radiation/quality/duration/timing/severity/associated sxs/prior Treatment) Patient c/o abscess on back of neck.   The history is provided by the patient.  Abscess  Location:  Head/neck Size:  3 Abscess quality: painful and redness   Red streaking: no   Pain details:    Timing:  Constant   Progression:  Worsening Chronicity:  New Relieved by:  Nothing Worsened by:  Nothing Ineffective treatments:  None tried   Past Medical History:  Diagnosis Date  . Hypertension   . Thyroid disease    Past Surgical History:  Procedure Laterality Date  . APPENDECTOMY    . CESAREAN SECTION    . TUBAL LIGATION     History reviewed. No pertinent family history. Social History  Substance Use Topics  . Smoking status: Never Smoker  . Smokeless tobacco: Never Used  . Alcohol use No   OB History    No data available     Review of Systems  Constitutional: Negative.   HENT: Negative.   Eyes: Negative.   Respiratory: Negative.   Gastrointestinal: Negative.   Endocrine: Negative.   Genitourinary: Negative.   Skin: Positive for wound.  Allergic/Immunologic: Negative.   Neurological: Negative.   Psychiatric/Behavioral: Negative.     Allergies  Patient has no known allergies.  Home Medications   Prior to Admission medications   Medication Sig Start Date End Date Taking? Authorizing Provider  levothyroxine (SYNTHROID, LEVOTHROID) 112 MCG tablet Take 112 mcg by mouth daily.   Yes Historical Provider, MD  traMADol (ULTRAM) 50 MG tablet Take 1 tablet (50 mg total) by mouth every 8 (eight) hours as needed. 11/30/15  Yes Gay Filler Copland, MD  fluconazole (DIFLUCAN) 150 MG tablet Take with onset of symptoms and repeat in a week. 12/08/16   Lysbeth Penner, FNP  meloxicam (MOBIC) 15 MG tablet Take 1 tablet (15 mg total) by mouth daily. Use as  needed for back pain Patient not taking: Reported on 02/05/2016 10/16/15   Mancel Bale, PA-C  metaxalone (SKELAXIN) 800 MG tablet Take 1 tablet (800 mg total) by mouth 3 (three) times daily as needed for muscle spasms. Patient not taking: Reported on 02/05/2016 10/16/15   Mancel Bale, PA-C  sulfamethoxazole-trimethoprim (BACTRIM DS,SEPTRA DS) 800-160 MG tablet Take 1 tablet by mouth 2 (two) times daily. 12/08/16 12/15/16  Lysbeth Penner, FNP   Meds Ordered and Administered this Visit  Medications - No data to display  BP 139/96 (BP Location: Left Arm)   Pulse 84   Temp 98.6 F (37 C) (Oral)   Resp 20   LMP 12/01/2016   SpO2 97%  No data found.   Physical Exam  Constitutional: She appears well-developed and well-nourished.  HENT:  Head: Normocephalic and atraumatic.  Eyes: EOM are normal. Pupils are equal, round, and reactive to light.  Neck: Normal range of motion. Neck supple.  Cardiovascular: Normal rate, regular rhythm and normal heart sounds.   Pulmonary/Chest: Effort normal and breath sounds normal.  Skin:  Abscess on back on neck left side.  Nursing note and vitals reviewed.   Urgent Care Course   Clinical Course     .Marland KitchenIncision and Drainage Date/Time: 12/08/2016 2:23 PM Performed by: Lysbeth Penner Authorized by: Melony Overly   Consent:    Consent obtained:  Verbal   Consent given by:  Patient   Risks discussed:  Incomplete drainage and bleeding   Alternatives discussed:  No treatment Location:    Type:  Abscess   Size:  4   Location:  Neck   Neck location:  R posterior Pre-procedure details:    Skin preparation:  Betadine Anesthesia (see MAR for exact dosages):    Anesthesia method:  Local infiltration   Local anesthetic:  Lidocaine 2% WITH epi Procedure type:    Complexity:  Simple Procedure details:    Needle aspiration: no     Incision types:  Stab incision   Incision depth:  Dermal   Scalpel blade:  11   Wound management:  Probed and  deloculated   Drainage:  Purulent and serosanguinous   Drainage amount:  Moderate   Wound treatment:  Drain placed   Packing materials:  1/2 in gauze   Amount 1/2":  3 cm Post-procedure details:    Patient tolerance of procedure:  Tolerated well, no immediate complications   (including critical care time)  Labs Review Labs Reviewed - No data to display  Imaging Review No results found.   Visual Acuity Review  Right Eye Distance:   Left Eye Distance:   Bilateral Distance:    Right Eye Near:   Left Eye Near:    Bilateral Near:         MDM   1. Abscess    Bactrim DS one po bid x 7 days #14 Diflucan 150mg  one po qd #2 Change dressing tomorrow and take packing out in 2 days. Follow up prn.      Lysbeth Penner, FNP 12/08/16 1425

## 2020-10-05 DIAGNOSIS — Q688 Other specified congenital musculoskeletal deformities: Secondary | ICD-10-CM | POA: Insufficient documentation

## 2021-09-14 ENCOUNTER — Encounter: Payer: Self-pay | Admitting: Physician Assistant

## 2021-09-14 ENCOUNTER — Telehealth: Payer: Self-pay | Admitting: Hematology and Oncology

## 2021-09-14 NOTE — Telephone Encounter (Signed)
Spoke with patient to confirm morning clinic for 10/26

## 2021-09-17 ENCOUNTER — Encounter: Payer: Self-pay | Admitting: *Deleted

## 2021-09-17 DIAGNOSIS — D0512 Intraductal carcinoma in situ of left breast: Secondary | ICD-10-CM | POA: Insufficient documentation

## 2021-09-21 NOTE — Progress Notes (Signed)
[place] CONSULT NOTE  Patient Care Team: Patient, No Pcp Per (Inactive) as PCP - General (General Practice) Mauro Kaufmann, RN as Oncology Nurse Navigator Rockwell Germany, RN as Oncology Nurse Navigator Donnie Mesa, MD as Consulting Physician (General Surgery) Nicholas Lose, MD as Consulting Physician (Hematology and Oncology) Eppie Gibson, MD as Attending Physician (Radiation Oncology)  CHIEF COMPLAINTS/PURPOSE OF CONSULTATION:  Newly diagnosed left breast DCIS  HISTORY OF PRESENTING ILLNESS:  Frances Cunningham 60 y.o. female is here because of recent diagnosis of DCIS of the left breast. Screening mammogram on 08/12/2021 showed indeterminate linear calcifications central to the nipple, 1 cm mass sub-areolar depth, and 0.7 cm mass at 1:00 in the left breast. Diagnostic mammogram and [place] on 08/23/2021 showed two 1 cm masses in the upper outer left breast and linear calcifications behind the nipple of the left breast. She presents to the clinic today for initial evaluation and discussion of treatment options.   I reviewed her records extensively and collaborated the history with the patient.  SUMMARY OF ONCOLOGIC HISTORY: Oncology History  Ductal carcinoma in situ (DCIS) of left breast  09/17/2021 Initial Diagnosis   Screening mammogram: indeterminate linear calcifications central to the nipple, 1 cm mass sub-areolar depth, and 0.7 cm mass at 1:00 in the left breast. Diagnostic mammogram and [place]: two 1 cm masses in the upper outer left breast and linear calcifications behind the nipple of the left breast.  Biopsy: DCIS with calcifications ER 100%, PR 40% early invasion cannot be ruled out   09/22/2021 Cancer Staging   Staging form: Breast, AJCC 8th Edition - Clinical: Stage 0 (cTis (DCIS), cN0, cM0, ER+, PR+, HER2: Not Assessed) - Signed by Nicholas Lose, MD on 09/22/2021 Stage prefix: Initial diagnosis      MEDICAL HISTORY:  Past Medical History:  Diagnosis Date    Breast cancer ([place])    Diabetes mellitus without complication ([facility])    Hypertension    Thyroid disease     SURGICAL HISTORY: Past Surgical History:  Procedure Laterality Date   APPENDECTOMY     CESAREAN SECTION     TUBAL LIGATION      SOCIAL HISTORY: Social History   Socioeconomic History   Marital status: Married    Spouse name: Not on file   Number of children: Not on file   Years of education: Not on file   Highest education level: Not on file  Occupational History   Not on file  Tobacco Use   Smoking status: Never   Smokeless tobacco: Never  Substance and Sexual Activity   Alcohol use: No   Drug use: No   Sexual activity: Not on file  Other Topics Concern   Not on file  Social History Narrative   Not on file   Social Determinants of Health   Financial Resource Strain: Not on file  Food Insecurity: Not on file  Transportation Needs: Not on file  Physical Activity: Not on file  Stress: Not on file  Social Connections: Not on file  Intimate Partner Violence: Not on file    FAMILY HISTORY: History reviewed. No pertinent family history.  ALLERGIES:  has No Known Allergies.  MEDICATIONS:  Current Outpatient Medications  Medication Sig Dispense Refill   fluconazole (DIFLUCAN) 150 MG tablet Take with onset of symptoms and repeat in a week. 2 tablet 0   levothyroxine (SYNTHROID, LEVOTHROID) 112 MCG tablet Take 112 mcg by mouth daily.     meloxicam (MOBIC) 15 MG tablet Take  1 tablet (15 mg total) by mouth daily. Use as needed for back pain (Patient not taking: Reported on 02/05/2016) 90 tablet 0   metaxalone (SKELAXIN) 800 MG tablet Take 1 tablet (800 mg total) by mouth 3 (three) times daily as needed for muscle spasms. (Patient not taking: Reported on 02/05/2016) 60 tablet 0   traMADol (ULTRAM) 50 MG tablet Take 1 tablet (50 mg total) by mouth every 8 (eight) hours as needed. 30 tablet 0   No current facility-administered medications for this visit.     REVIEW OF SYSTEMS:   Constitutional: Denies fevers, chills or abnormal night sweats Eyes: Denies blurriness of vision, double vision or watery eyes Ears, nose, mouth, throat, and face: Denies mucositis or sore throat Respiratory: Denies cough, dyspnea or wheezes Cardiovascular: Denies palpitation, chest discomfort or lower extremity swelling Gastrointestinal:  Denies nausea, heartburn or change in bowel habits Skin: Denies abnormal skin rashes Lymphatics: Denies new lymphadenopathy or easy bruising Neurological:Denies numbness, tingling or new weaknesses Behavioral/Psych: Mood is stable, no new changes  Breast:  Denies any palpable lumps or discharge All other systems were reviewed with the patient and are negative.  PHYSICAL EXAMINATION: ECOG PERFORMANCE STATUS: 0 - Asymptomatic  Vitals:   09/22/21 0914  BP: 138/76  Pulse: 99  Resp: 19  Temp: (!) 97.5 F (36.4 C)  SpO2: 99%   Filed Weights   09/22/21 0914  Weight: 271 lb (122.9 kg)      LABORATORY DATA:  I have reviewed the data as listed Lab Results  Component Value Date   WBC 7.7 09/22/2021   HGB 13.9 09/22/2021   HCT 39.6 09/22/2021   MCV 86.1 09/22/2021   PLT 250 09/22/2021   Lab Results  Component Value Date   NA 141 09/22/2021   K 3.8 09/22/2021   CL 110 09/22/2021   CO2 21 (L) 09/22/2021    RADIOGRAPHIC STUDIES: I have personally reviewed the radiological reports and agreed with the findings in the report.  ASSESSMENT AND PLAN:  Ductal carcinoma in situ (DCIS) of left breast October 2022: Screening mammogram: indeterminate linear calcifications central to the nipple, 1 cm mass sub-areolar depth, and 0.7 cm mass at 1:00 in the left breast. Diagnostic mammogram and [place]: two 1 cm masses in the upper outer left breast and linear calcifications behind the nipple of the left breast.  Biopsy: DCIS with calcifications ER 100%, PR 40% early invasion cannot be ruled out  Pathology review: I discussed with  the patient the difference between DCIS and invasive breast cancer. It is considered a precancerous lesion. DCIS is classified as a 0. It is generally detected through mammograms as calcifications. We discussed the significance of grades and its impact on prognosis. We also discussed the importance of ER and PR receptors and their implications to adjuvant treatment options. Prognosis of DCIS dependence on grade, comedo necrosis. It is anticipated that if not treated, 20-30% of DCIS can develop into invasive breast cancer.  Recommendation: 1. Breast conserving surgery 2. Followed by adjuvant radiation therapy 3. Followed by antiestrogen therapy with tamoxifen 5 years  Tamoxifen counseling: We discussed the risks and benefits of tamoxifen. These include but not limited to insomnia, hot flashes, mood changes, vaginal dryness, and weight gain. Although rare, serious side effects including endometrial cancer, risk of blood clots were also discussed. We strongly believe that the benefits far outweigh the risks. Patient understands these risks and consented to starting treatment. Planned treatment duration is 5 years.  Return to clinic after  surgery to discuss the final pathology report and come up with an adjuvant treatment plan.     All questions were answered. The patient knows to call the clinic with any problems, questions or concerns.   Rulon Eisenmenger, MD, MPH 09/22/2021    I, Thana Ates, am acting as scribe for Nicholas Lose, MD.  I have reviewed the above documentation for accuracy and completeness, and I agree with the above.

## 2021-09-21 NOTE — Progress Notes (Signed)
Radiation Oncology         ([phone]) [phone] ________________________________  Initial Outpatient Consultation  Name: Frances Cunningham MRN: [id number]  Date: 09/22/2021  DOB: 09-10-1961  IR:[id number], No Pcp Per (Inactive)  Donnie Mesa, MD   REFERRING PHYSICIAN: Donnie Mesa, MD  DIAGNOSIS:    ICD-10-CM   1. Ductal carcinoma in situ (DCIS) of left breast  D05.12      [id number] DCIS, left breast, ER+, grade pending   CHIEF COMPLAINT: Here to discuss management of left breast cancer  HISTORY OF PRESENT ILLNESS::Frances Cunningham is a 60 y.o. female who presented with left breast abnormality on the following imaging: bilateral screening mammogram on the date of 08/12/21.  Left breast diagnostic mammogram on 08/23/21 further demonstrated two 1 cm round masses in the anterior left upper-outer breast, as well as the associated indeterminate linear calcifications in the left nipple. No symptoms, if any, were reported at that time.   Ultrasound of breast on 08/23/21 even further revealed the cluster of irregular masses and calcifications in the left breast at the 1 o'clock anterior depth as high suggestive of malignancy. Differential diagnoses included DCIS and/or IDC. Additionally, the left axillary lymph node was noted as suspicious of malignancy.      Biopsy results were reviewed today in breast tumor board conference.  Biopsy of the breast at 12:00 revealed DCIS with possible microinvasion.  Biopsy at 1:00 revealed DCIS as well.  This is ER 100% strongly positive PR 40% moderately positive.  Biopsy of the lymph node was negative.  Grade is pending.   She is otherwise in her usual state of health.  She reports that she received radioactive iodine for Graves' disease in the past.  PREVIOUS RADIATION THERAPY: Yes- radioactive iodine treatment for Graves' disease  PAST MEDICAL HISTORY:  has a past medical history of Breast cancer ([place]), Diabetes mellitus without complication ([place]), Hypertension, and Thyroid  disease.    PAST SURGICAL HISTORY: Past Surgical History:  Procedure Laterality Date   APPENDECTOMY     CESAREAN SECTION     TUBAL LIGATION      FAMILY HISTORY: family history is not on file.  SOCIAL HISTORY:  reports that she has never smoked. She has never used smokeless tobacco. She reports that she does not drink alcohol and does not use drugs.  ALLERGIES: Patient has no known allergies.  MEDICATIONS:  Current Outpatient Medications  Medication Sig Dispense Refill   fluconazole (DIFLUCAN) 150 MG tablet Take with onset of symptoms and repeat in a week. 2 tablet 0   levothyroxine (SYNTHROID, LEVOTHROID) 112 MCG tablet Take 112 mcg by mouth daily.     meloxicam (MOBIC) 15 MG tablet Take 1 tablet (15 mg total) by mouth daily. Use as needed for back pain (Patient not taking: Reported on 02/05/2016) 90 tablet 0   metaxalone (SKELAXIN) 800 MG tablet Take 1 tablet (800 mg total) by mouth 3 (three) times daily as needed for muscle spasms. (Patient not taking: Reported on 02/05/2016) 60 tablet 0   traMADol (ULTRAM) 50 MG tablet Take 1 tablet (50 mg total) by mouth every 8 (eight) hours as needed. 30 tablet 0   No current facility-administered medications for this encounter.    REVIEW OF SYSTEMS: As above in HPI.   PHYSICAL EXAM:  vitals were not taken for this visit.   General: Alert and oriented, in no acute distress. Heart: Regular in rate and rhythm with no murmurs, rubs, or gallops. Chest: Clear to auscultation bilaterally, with no rhonchi,  wheezes, or rales. Skin: No concerning lesions over chest. Musculoskeletal: symmetric strength and muscle tone throughout. Neurologic: Cranial nerves II through XII are grossly intact. No obvious focalities. Speech is fluent. Coordination is intact. Psychiatric: Judgment and insight are intact. Affect is appropriate. Breasts: Firmness around the left areola, particularly in the lower outer quadrant. No other palpable masses appreciated in the  breasts or axillae bilaterally.   ECOG = 0  0 - Asymptomatic (Fully active, able to carry on all predisease activities without restriction)  1 - Symptomatic but completely ambulatory (Restricted in physically strenuous activity but ambulatory and able to carry out work of a light or sedentary nature. For example, light housework, office work)  2 - Symptomatic, <50% in bed during the day (Ambulatory and capable of all self care but unable to carry out any work activities. Up and about more than 50% of waking hours)  3 - Symptomatic, >50% in bed, but not bedbound (Capable of only limited self-care, confined to bed or chair 50% or more of waking hours)  4 - Bedbound (Completely disabled. Cannot carry on any self-care. Totally confined to bed or chair)  5 - Death   Eustace Pen MM, Creech RH, Tormey DC, et al. [phone]). "Toxicity and response criteria of the [facility]". [place] Oncol. 5 (6): 649-55   LABORATORY DATA:  Lab Results  Component Value Date   WBC 7.7 09/22/2021   HGB 13.9 09/22/2021   HCT 39.6 09/22/2021   MCV 86.1 09/22/2021   PLT 250 09/22/2021   CMP     Component Value Date/Time   NA 141 09/22/2021 0855   K 3.8 09/22/2021 0855   CL 110 09/22/2021 0855   CO2 21 (L) 09/22/2021 0855   GLUCOSE 150 (H) 09/22/2021 0855   BUN 17 09/22/2021 0855   CREATININE 1.02 (H) 09/22/2021 0855   CREATININE 1.08 04/09/2013 1137   CALCIUM 8.8 (L) 09/22/2021 0855   PROT 7.9 09/22/2021 0855   ALBUMIN 3.8 09/22/2021 0855   AST 17 09/22/2021 0855   ALT 21 09/22/2021 0855   ALKPHOS 99 09/22/2021 0855   BILITOT 0.7 09/22/2021 0855   GFRNONAA >60 09/22/2021 0855       RADIOGRAPHY: As above    IMPRESSION/PLAN: This is a very nice 60 year old woman with central DCIS of the left breast.  We discussed her tumor board conference today.  She understands that she will need a central lumpectomy which will involve removing her nipple.  She understands that she does not  need a mastectomy for cure.  She is enthusiastic about breast conserving surgery and adjuvant therapies to reduce her risk of relapse  It was a pleasure meeting the patient today. We discussed the risks, benefits, and side effects of radiotherapy. I recommend radiotherapy to the left breast to reduce her risk of locoregional recurrence by half.  We discussed that radiation would take approximately 3-4 weeks to complete and that I would give the patient a few weeks to heal following surgery before starting treatment planning.  We spoke about acute effects including skin irritation and fatigue as well as much less common late effects including internal organ injury or irritation. We spoke about the latest technology that is used to minimize the risk of late effects for patients undergoing radiotherapy to the breast or chest wall. No guarantees of treatment were given. The patient is enthusiastic about proceeding with treatment. I look forward to participating in the patient's care.  I will await her referral  back to me for postoperative follow-up and eventual CT simulation/treatment planning.  On date of service, in total, I spent 45 minutes on this encounter. Patient was seen in person.   __________________________________________   Eppie Gibson, MD  This document serves as a record of services personally performed by Eppie Gibson, MD. It was created on her behalf by Roney Mans, a trained medical scribe. The creation of this record is based on the scribe's personal observations and the provider's statements to them. This document has been checked and approved by the attending provider.

## 2021-09-22 ENCOUNTER — Inpatient Hospital Stay (HOSPITAL_BASED_OUTPATIENT_CLINIC_OR_DEPARTMENT_OTHER): Payer: Managed Care, Other (non HMO) | Admitting: Hematology and Oncology

## 2021-09-22 ENCOUNTER — Ambulatory Visit: Payer: Self-pay | Admitting: Surgery

## 2021-09-22 ENCOUNTER — Encounter: Payer: Self-pay | Admitting: Hematology and Oncology

## 2021-09-22 ENCOUNTER — Ambulatory Visit: Payer: Managed Care, Other (non HMO) | Admitting: Physical Therapy

## 2021-09-22 ENCOUNTER — Inpatient Hospital Stay: Payer: Managed Care, Other (non HMO) | Attending: Hematology and Oncology

## 2021-09-22 ENCOUNTER — Other Ambulatory Visit: Payer: Self-pay

## 2021-09-22 ENCOUNTER — Encounter: Payer: Self-pay | Admitting: *Deleted

## 2021-09-22 ENCOUNTER — Encounter: Payer: Self-pay | Admitting: Radiation Oncology

## 2021-09-22 ENCOUNTER — Ambulatory Visit
Admission: RE | Admit: 2021-09-22 | Discharge: 2021-09-22 | Disposition: A | Discharge status: Home / Self Care | Payer: Managed Care, Other (non HMO) | Source: Ambulatory Visit | Attending: Radiation Oncology | Admitting: Radiation Oncology

## 2021-09-22 ENCOUNTER — Ambulatory Visit (HOSPITAL_BASED_OUTPATIENT_CLINIC_OR_DEPARTMENT_OTHER): Payer: Managed Care, Other (non HMO) | Admitting: Genetic Counselor

## 2021-09-22 DIAGNOSIS — D0512 Intraductal carcinoma in situ of left breast: Secondary | ICD-10-CM

## 2021-09-22 DIAGNOSIS — Z853 Personal history of malignant neoplasm of breast: Secondary | ICD-10-CM

## 2021-09-22 LAB — CMP (CANCER CENTER ONLY)
ALT: 21 U/L (ref 0–44)
AST: 17 U/L (ref 15–41)
Albumin: 3.8 g/dL (ref 3.5–5.0)
Alkaline Phosphatase: 99 U/L (ref 38–126)
Anion gap: 10 (ref 5–15)
BUN: 17 mg/dL (ref 8–23)
CO2: 21 mmol/L — ABNORMAL LOW (ref 22–32)
Calcium: 8.8 mg/dL — ABNORMAL LOW (ref 8.9–10.3)
Chloride: 110 mmol/L (ref 98–111)
Creatinine: 1.02 mg/dL — ABNORMAL HIGH (ref 0.44–1.00)
GFR, Estimated: >60 mL/min (ref >60)
Glucose, Bld: 150 mg/dL — ABNORMAL HIGH (ref 70–99)
Potassium: 3.8 mmol/L (ref 3.5–5.1)
Sodium: 141 mmol/L (ref 135–145)
Total Bilirubin: 0.7 mg/dL (ref 0.3–1.2)
Total Protein: 7.9 g/dL (ref 6.5–8.1)

## 2021-09-22 LAB — CBC WITH DIFFERENTIAL (CANCER CENTER ONLY)
Abs Immature Granulocytes: 0.02 10*3/uL (ref 0.00–0.07)
Basophils Absolute: 0 10*3/uL (ref 0.0–0.1)
Basophils Relative: 0 %
Eosinophils Absolute: 0.1 10*3/uL (ref 0.0–0.5)
Eosinophils Relative: 2 %
HCT: 39.6 % (ref 36.0–46.0)
Hemoglobin: 13.9 g/dL (ref 12.0–15.0)
Immature Granulocytes: 0 %
Lymphocytes Relative: 26 %
Lymphs Abs: 2 10*3/uL (ref 0.7–4.0)
MCH: 30.2 pg (ref 26.0–34.0)
MCHC: 35.1 g/dL (ref 30.0–36.0)
MCV: 86.1 fL (ref 80.0–100.0)
Monocytes Absolute: 0.6 10*3/uL (ref 0.1–1.0)
Monocytes Relative: 7 %
Neutro Abs: 5 10*3/uL (ref 1.7–7.7)
Neutrophils Relative %: 65 %
Platelet Count: 250 10*3/uL (ref 150–400)
RBC: 4.6 MIL/uL (ref 3.87–5.11)
RDW: 13.3 % (ref 11.5–15.5)
WBC Count: 7.7 10*3/uL (ref 4.0–10.5)
nRBC: 0 % (ref 0.0–0.2)

## 2021-09-22 LAB — GENETIC SCREENING ORDER

## 2021-09-22 NOTE — Assessment & Plan Note (Signed)
October 2022: Screening mammogram: indeterminate linear calcifications central to the nipple, 1 cm mass sub-areolar depth, and 0.7 cm mass at 1:00 in the left breast. Diagnostic mammogram and [place]: two 1 cm masses in the upper outer left breast and linear calcifications behind the nipple of the left breast.  Biopsy: DCIS with calcifications ER 100%, PR 40% early invasion cannot be ruled out  Pathology review: I discussed with the patient the difference between DCIS and invasive breast cancer. It is considered a precancerous lesion. DCIS is classified as a 0. It is generally detected through mammograms as calcifications. We discussed the significance of grades and its impact on prognosis. We also discussed the importance of ER and PR receptors and their implications to adjuvant treatment options. Prognosis of DCIS dependence on grade, comedo necrosis. It is anticipated that if not treated, 20-30% of DCIS can develop into invasive breast cancer.  Recommendation: 1. Breast conserving surgery 2. Followed by adjuvant radiation therapy 3. Followed by antiestrogen therapy with tamoxifen 5 years  Tamoxifen counseling: We discussed the risks and benefits of tamoxifen. These include but not limited to insomnia, hot flashes, mood changes, vaginal dryness, and weight gain. Although rare, serious side effects including endometrial cancer, risk of blood clots were also discussed. We strongly believe that the benefits far outweigh the risks. Patient understands these risks and consented to starting treatment. Planned treatment duration is 5 years.  Return to clinic after surgery to discuss the final pathology report and come up with an adjuvant treatment plan.

## 2021-09-22 NOTE — H&P (View-Only) (Signed)
Subjective   Chief Complaint: Breast Cancer     History of Present Illness: Frances Cunningham is a 60 y.o. female who is seen today as an office consultation for evaluation of Breast Cancer   Breast [place] 10/26 - [place]  This is a 61 year old female who recently underwent screening mammogram 08/12/21 that revealed some calcifications in the retroareolar left breast, a 0.7 cm mass at 1:00, and a 1 cm in the left subareolar area.  Biopsy was recommended.  Pathology showed DCIS with calcifications, ER/ PR positive.    The microcalcifications track up to the surface of the skin of the nipple.  The mass at 12:00 is farther from the nipple.  No family history of breast cancer.   Review of Systems: A complete review of systems was obtained from the patient.  I have reviewed this information and discussed as appropriate with the patient.  See HPI as well for other ROS.  Review of Systems  Constitutional: Negative.   HENT: Negative.   Eyes: Negative.   Respiratory: Negative.   Cardiovascular: Negative.   Gastrointestinal: Negative.   Genitourinary: Negative.   Musculoskeletal: Negative.   Skin: Negative.   Neurological: Negative.   Endo/Heme/Allergies: Negative.   Psychiatric/Behavioral: Negative.       Medical History: Past Medical History:  Diagnosis Date   Arthritis    Diabetes mellitus without complication (CMS-[facility])    Hypertension    Thyroid disease     Patient Active Problem List  Diagnosis   Intraductal carcinoma in situ of left breast   Graves disease   Postablative hypothyroidism   Essential hypertension, benign    Past Surgical History:  Procedure Laterality Date   APPENDECTOMY N/A    Date Unknown   CESAREAN SECTION N/A    Date Unknown   Partial Removal of Thyroid N/A    Date Unknown   Tubal Ligation N/A    Date Unknown     No Known Allergies  Current Outpatient Medications on File Prior to Visit  Medication Sig Dispense Refill    amLODIPine (NORVASC) 5 MG tablet amlodipine 5 mg tablet  TAKE 1 TABLET BY MOUTH DAILY     dulaglutide (TRULICITY) 5.10 CH/8.5 mL subcutaneous pen injector Trulicity 2.77 OE/4.2 mL subcutaneous pen injector  INJECT UNDER THE SKIN ONCE WEEKLY     levothyroxine (SYNTHROID) 137 MCG tablet levothyroxine 137 mcg tablet  TAKE 1 TABLET BY MOUTH DAILY     No current facility-administered medications on file prior to visit.    History reviewed. No pertinent family history.   Social History   Tobacco Use  Smoking Status Never Smoker  Smokeless Tobacco Never Used     Social History   Socioeconomic History   Marital status: Unknown  Tobacco Use   Smoking status: Never Smoker   Smokeless tobacco: Never Used  Scientific laboratory technician Use: Never used  Substance and Sexual Activity   Alcohol use: Never   Drug use: Never   Sexual activity: Defer    Objective:    There were no vitals filed for this visit.  There is no height or weight on file to calculate BMI.  Physical Exam   Constitutional:  WDWN in NAD, conversant, no obvious deformities; lying in bed comfortably Eyes:  Pupils equal, round; sclera anicteric; moist conjunctiva; no lid lag HENT:  Oral mucosa moist; good dentition  Neck:  No masses palpated, trachea midline; no thyromegaly Lungs:  CTA bilaterally; normal respiratory effort  Breasts:  symmetric, no nipple changes; no lymphadenopathy on either side; left retroareolar region shows some nodularity; no right breast masses CV:  Regular rate and rhythm; no murmurs; extremities well-perfused with no edema Abd:  +bowel sounds, soft, non-tender, no palpable organomegaly; no palpable hernias Musc:  Unable to assess gait; no apparent clubbing or cyanosis in extremities Lymphatic:  No palpable cervical or axillary lymphadenopathy Skin:  Warm, dry; no sign of jaundice Psychiatric - alert and oriented x 4; calm mood and affect     Assessment and Plan:  Diagnoses and all orders for  this visit:  Intraductal carcinoma in situ of left breast    Due to the location of the calcifications tracking up to the skin of the nipple, she will need a central breast excision.  We will also use radioactive seed localization of the mass furthest from the nipple (12:00) to make sure that we include that in the specimen.  We will inject Magtrace at the time of surgery in case her pathology comes back with invasive cancer to expedite the sentinel lymph node biopsy.    The surgical procedure has been discussed with the patient.  Potential risks, benefits, alternative treatments, and expected outcomes have been explained.  All of the patient's questions at this time have been answered.  The likelihood of reaching the patient's treatment goal is good.  The patient understand the proposed surgical procedure and wishes to proceed.  Adjuvant radiation and antiestrogens   Frances Chern Jearld Adjutant, MD  09/22/2021 11:04 AM   I had direct face-to-face contact with the patient for a total of 60 minutes and greater than 50% of that time was spent providing counseling and/or coordination of care for the patient regarding her left breast DCIS.

## 2021-09-22 NOTE — Progress Notes (Signed)
[place] Work  Initial Assessment   Frances Cunningham is a 60 y.o. year old female presenting alone. Clinical Social Work was referred by [facility] for assessment of psychosocial needs.   SDOH (Social Determinants of Health) assessments performed: Yes SDOH Interventions    Flowsheet Row Most Recent Value  SDOH Interventions   Food Insecurity Interventions Intervention Not Indicated  Financial Strain Interventions Intervention Not Indicated  Housing Interventions Intervention Not Indicated  Transportation Interventions Intervention Not Indicated       Distress Screen completed: Yes ONCBCN DISTRESS SCREENING 09/22/2021  Screening Type Initial Screening  Distress experienced in past week (1-10) 3  Practical problem type Insurance  Emotional problem type Nervousness/Anxiety;Adjusting to illness      Family/Social Information:  Housing Arrangement: patient lives with husband Frances Cunningham. Family members/support persons in your life? Family, husband, 2 children in [place], 43 year old grandchild, sister in [place]. Transportation concerns: no  Employment: Retired. Income source: Paediatric nurse concerns: No Type of concern: Medical bills and unsure of what insurance will cover. Food access concerns: no Religious or spiritual practice: not addressed Medication Concerns: no  Services Currently in place:  n/a  Coping/ Adjustment to diagnosis: Patient understands treatment plan and what happens next? yes Concerns about diagnosis and/or treatment: I'm not especially worried about anything and "I'm very lucky". Patient reported stressors:  none Hopes and priorities: "Get this over with" and continue living life fully. Patient enjoys exercise, reading, time with family/ friends, and going for walks. Current coping skills/ strengths: Average or above average intelligence , Motivation for treatment/growth , and Supportive family/friends      SUMMARY: Current SDOH Barriers:  None identified at this time   Interventions: Discussed common feeling and emotions when being diagnosed with cancer, and the importance of support during treatment Informed patient of the support team roles and support services at [facility] Provided [facility] contact information and encouraged patient to call with any questions or concerns   Follow Up Plan: Patient will contact CSW with any support or resource needs Patient verbalizes understanding of plan: Yes   Frances Cunningham, Social Work Intern Supervised by Frances Cunningham , LCSW

## 2021-09-22 NOTE — H&P (Signed)
Subjective   Chief Complaint: Breast Cancer     History of Present Illness: Frances Cunningham is a 60 y.o. female who is seen today as an office consultation for evaluation of Breast Cancer   Breast Shedd 10/26 - [place]  This is a 60 year old female who recently underwent screening mammogram 08/12/21 that revealed some calcifications in the retroareolar left breast, a 0.7 cm mass at 1:00, and a 1 cm in the left subareolar area.  Biopsy was recommended.  Pathology showed DCIS with calcifications, ER/ PR positive.    The microcalcifications track up to the surface of the skin of the nipple.  The mass at 12:00 is farther from the nipple.  No family history of breast cancer.   Review of Systems: A complete review of systems was obtained from the patient.  I have reviewed this information and discussed as appropriate with the patient.  See HPI as well for other ROS.  Review of Systems  Constitutional: Negative.   HENT: Negative.   Eyes: Negative.   Respiratory: Negative.   Cardiovascular: Negative.   Gastrointestinal: Negative.   Genitourinary: Negative.   Musculoskeletal: Negative.   Skin: Negative.   Neurological: Negative.   Endo/Heme/Allergies: Negative.   Psychiatric/Behavioral: Negative.       Medical History: Past Medical History:  Diagnosis Date   Arthritis    Diabetes mellitus without complication (CMS-HCC)    Hypertension    Thyroid disease     Patient Active Problem List  Diagnosis   Intraductal carcinoma in situ of left breast   Graves disease   Postablative hypothyroidism   Essential hypertension, benign    Past Surgical History:  Procedure Laterality Date   APPENDECTOMY N/A    Date Unknown   CESAREAN SECTION N/A    Date Unknown   Partial Removal of Thyroid N/A    Date Unknown   Tubal Ligation N/A    Date Unknown     No Known Allergies  Current Outpatient Medications on File Prior to Visit  Medication Sig Dispense Refill    amLODIPine (NORVASC) 5 MG tablet amlodipine 5 mg tablet  TAKE 1 TABLET BY MOUTH DAILY     dulaglutide (TRULICITY) 5.17 OH/6.0 mL subcutaneous pen injector Trulicity 7.37 TG/6.2 mL subcutaneous pen injector  INJECT UNDER THE SKIN ONCE WEEKLY     levothyroxine (SYNTHROID) 137 MCG tablet levothyroxine 137 mcg tablet  TAKE 1 TABLET BY MOUTH DAILY     No current facility-administered medications on file prior to visit.    History reviewed. No pertinent family history.   Social History   Tobacco Use  Smoking Status Never Smoker  Smokeless Tobacco Never Used     Social History   Socioeconomic History   Marital status: Unknown  Tobacco Use   Smoking status: Never Smoker   Smokeless tobacco: Never Used  Scientific laboratory technician Use: Never used  Substance and Sexual Activity   Alcohol use: Never   Drug use: Never   Sexual activity: Defer    Objective:    There were no vitals filed for this visit.  There is no height or weight on file to calculate BMI.  Physical Exam   Constitutional:  WDWN in NAD, conversant, no obvious deformities; lying in bed comfortably Eyes:  Pupils equal, round; sclera anicteric; moist conjunctiva; no lid lag HENT:  Oral mucosa moist; good dentition  Neck:  No masses palpated, trachea midline; no thyromegaly Lungs:  CTA bilaterally; normal respiratory effort  Breasts:  symmetric, no nipple changes; no lymphadenopathy on either side; left retroareolar region shows some nodularity; no right breast masses CV:  Regular rate and rhythm; no murmurs; extremities well-perfused with no edema Abd:  +bowel sounds, soft, non-tender, no palpable organomegaly; no palpable hernias Musc:  Unable to assess gait; no apparent clubbing or cyanosis in extremities Lymphatic:  No palpable cervical or axillary lymphadenopathy Skin:  Warm, dry; no sign of jaundice Psychiatric - alert and oriented x 4; calm mood and affect     Assessment and Plan:  Diagnoses and all orders for  this visit:  Intraductal carcinoma in situ of left breast    Due to the location of the calcifications tracking up to the skin of the nipple, she will need a central breast excision.  We will also use radioactive seed localization of the mass furthest from the nipple (12:00) to make sure that we include that in the specimen.  We will inject Magtrace at the time of surgery in case her pathology comes back with invasive cancer to expedite the sentinel lymph node biopsy.    The surgical procedure has been discussed with the patient.  Potential risks, benefits, alternative treatments, and expected outcomes have been explained.  All of the patient's questions at this time have been answered.  The likelihood of reaching the patient's treatment goal is good.  The patient understand the proposed surgical procedure and wishes to proceed.  Adjuvant radiation and antiestrogens   Liston Thum Jearld Adjutant, MD  09/22/2021 11:04 AM   I had direct face-to-face contact with the patient for a total of 60 minutes and greater than 50% of that time was spent providing counseling and/or coordination of care for the patient regarding her left breast DCIS.

## 2021-09-23 ENCOUNTER — Encounter: Payer: Self-pay | Admitting: *Deleted

## 2021-09-23 NOTE — Progress Notes (Signed)
REFERRING PROVIDER: Nicholas Lose, MD [place], [place] [address]  PRIMARY PROVIDER:  Patient, No Pcp Per (Inactive)  PRIMARY REASON FOR VISIT:  1. Personal history of breast cancer     HISTORY OF PRESENT ILLNESS:   Ms. Withrow, a 60 y.o. female, was seen for a [place] cancer genetics consultation during the breast multidisciplinary clinic at the request of Dr. Lindi Adie due to a personal history of cancer.  Ms. Palla presents to clinic today to discuss the possibility of a hereditary predisposition to cancer, to discuss genetic testing, and to further clarify her future cancer risks, as well as potential cancer risks for family members.   In October 2022, at the age of 27, Ms. 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   CANCER HISTORY:  Oncology History  Ductal carcinoma in situ (DCIS) of left breast (Resolved)  09/17/2021 Initial Diagnosis   Screening mammogram: indeterminate linear calcifications central to the nipple, 1 cm mass sub-areolar depth, and 0.7 cm mass at 1:00 in the left breast. Diagnostic mammogram and [place]: two 1 cm masses in the upper outer left breast and linear calcifications behind the nipple of the left breast.  Biopsy: DCIS with calcifications ER 100%, PR 40% early invasion cannot be ruled out   09/22/2021 Cancer Staging   Staging form: Breast, AJCC 8th Edition - Clinical: Stage 0 (cTis (DCIS), cN0, cM0, ER+, PR+, HER2: Not Assessed) - Signed by Nicholas Lose, MD on 09/22/2021 Stage prefix: Initial diagnosis      RISK FACTORS:  Menarche was at age 67.  First live birth at age 89.  OCP use for approximately 0 years.  Ovaries intact: yes.  Uterus intact: yes.  Menopausal status: postmenopausal.  HRT use: 0 years. Colonoscopy: yes Mammogram within the last year: yes. Any excessive radiation exposure in the past: no  Past Medical History:  Diagnosis Date   Breast cancer ([place])    Diabetes mellitus  without complication ([place])    Hypertension    Thyroid disease     Past Surgical History:  Procedure Laterality Date   APPENDECTOMY     CESAREAN SECTION     TUBAL LIGATION      Social History   Socioeconomic History   Marital status: Married    Spouse name: Not on file   Number of children: Not on file   Years of education: Not on file   Highest education level: Not on file  Occupational History   Not on file  Tobacco Use   Smoking status: Never   Smokeless tobacco: Never  Substance and Sexual Activity   Alcohol use: No   Drug use: No   Sexual activity: Not on file  Other Topics Concern   Not on file  Social History Narrative   Not on file   Social Determinants of Health   Financial Resource Strain: Low Risk    Difficulty of Paying Living Expenses: Not very hard  Food Insecurity: No Food Insecurity   Worried About Running Out of Food in the Last Year: Never true   [place] in the Last Year: Never true  Transportation Needs: No Transportation Needs   Lack of Transportation (Medical): No   Lack of Transportation (Non-Medical): No  Physical Activity: Not on file  Stress: Not on file  Social Connections: Not on file     FAMILY HISTORY:  Ms. Goguen does not have any known family history of cancer and no  family history of hereditary cancer genetic testing. There no reported Ashkenazi Jewish ancestry.     GENETIC COUNSELING ASSESSMENT: Ms. Hyslop is a 60 y.o. female with a personal history of breast cancer. We, therefore, discussed and recommended the following at today's visit.   DISCUSSION: We discussed that 5 - 10% of cancer is hereditary, with most cases of hereditary breast cancer associated with mutations in BRCA1/2.  There are other genes that can be associated with hereditary breast cancer syndromes. Type of cancer risk and level of risk are gene-specific. We discussed that testing is beneficial for several reasons including knowing how to follow  individuals after completing their treatment, identifying whether potential treatment options would be beneficial, and understanding if other family members could be at risk for cancer and allowing them to undergo genetic testing.   We reviewed the characteristics, features and inheritance patterns of hereditary cancer syndromes. We also discussed genetic testing, including the appropriate family members to test, the process of testing, insurance coverage and turn-around-time for results. We discussed the implications of a negative, positive and/or variant of uncertain significant result. In order to get genetic test results in a timely manner so that Ms. Demmon can use these genetic test results for surgical decisions, we recommended Ms. Hartel pursue genetic testing for the BRCAplus. Once complete, we recommend Ms. Mui pursue reflex genetic testing to a more comprehensive gene panel.   Ms. Mcguinn  was offered a common hereditary cancer panel (47 genes) and an expanded pan-cancer panel (77 genes). Ms. Verner was informed of the benefits and limitations of each panel, including that expanded pan-cancer panels contain genes that do not have clear management guidelines at this point in time.  We also discussed that as the number of genes included on a panel increases, the chances of variants of uncertain significance increases.  After considering the benefits and limitations of each gene panel, Ms. Kimbler elected to have [facility]'s CustomNext (47 gene) Panel.  The CustomNext-Cancer+RNAinsight panel offered by [facility] includes sequencing and rearrangement analysis for the following 47 genes:  APC, ATM, AXIN2, BARD1, BMPR1A, BRCA1, BRCA2, BRIP1, CDH1, CDK4, CDKN2A, CHEK2, DICER1, EPCAM, GREM1, HOXB13, MEN1, MLH1, MSH2, MSH3, MSH6, MUTYH, NBN, NF1, NF2, NTHL1, PALB2, PMS2, POLD1, POLE, PTEN, RAD51C, RAD51D, RECQL, RET, SDHA, SDHAF2, SDHB, SDHC, SDHD, SMAD4, SMARCA4, STK11, TP53, TSC1, TSC2, and VHL.  RNA  data is routinely analyzed for use in variant interpretation for all genes.  Based on Ms. Wethington's personal history of cancer and no family history of cancer, she does not meet medical criteria for genetic testing. Therefore, she may have an out of pocket cost. We discussed that if her out of pocket cost for testing is over $100, the laboratory should contact them to discuss self-pay prices, patient pay assistance programs, if applicable, and other billing options.   PLAN: After considering the risks, benefits, and limitations, Ms. Thurlow provided informed consent to pursue genetic testing and the blood sample was sent to [facility] for analysis of the BRCAplus with reflex to CustomNext Panel+RNA (47 genes). Results should be available within approximately 1-2 weeks' time, at which point they will be disclosed by telephone to Ms. Resch, as will any additional recommendations warranted by these results. Ms. Megill will receive a summary of her genetic counseling visit and a copy of her results once available. This information will also be available in Epic.   Ms. Dines questions were answered to her satisfaction today. Our contact information was provided should additional questions  or concerns arise. Thank you for the referral and allowing [place] to share in the care of your patient.   [name], MS, [facility] Genetic Counselor [email or web address] (P) [phone]  The patient was seen for a total of 20 minutes in face-to-face genetic counseling. The patient was seen alone.  Drs. Magrinat, Lindi Adie and/or Burr Medico were available to discuss this case as needed.  _______________________________________________________________________ For Office Staff:  Number of people involved in session: 1 Was an Intern/ student involved with case: no

## 2021-09-24 ENCOUNTER — Encounter: Payer: Self-pay | Admitting: *Deleted

## 2021-09-24 DIAGNOSIS — D0512 Intraductal carcinoma in situ of left breast: Secondary | ICD-10-CM

## 2021-09-27 NOTE — Progress Notes (Signed)
Surgical Instructions    Your procedure is scheduled on Wednesday, November 2nd.  Report to [facility] Main Entrance "A" at 9:30 A.M., then check in with the Admitting office.  Call this number if you have problems the morning of surgery:  [phone]   If you have any questions prior to your surgery date call [phone]: Open Monday-Friday 8am-4pm    Remember:  Do not eat after midnight the night before your surgery  You may drink clear liquids until 8:30 AM the morning of your surgery.   Clear liquids allowed are: Water, Non-Citrus Juices (without pulp), Carbonated Beverages, Clear Tea, Black Coffee ONLY (NO MILK, CREAM OR POWDERED CREAMER of any kind), and Gatorade    Take these medicines the morning of surgery with A SIP OF WATER  Amlodipine (Norvasc)  Levothyroxine (Synthroid)    If Needed:  Tylenol  Albuterol inhaler (bring with you on day of surgery) Gabapentin (Neurontin)   As of today, STOP taking any Aspirin (unless otherwise instructed by your surgeon) Aleve, Naproxen, Ibuprofen, Motrin, Advil, Goody's, BC's, all herbal medications, fish oil, and all vitamins.  WHAT DO I DO ABOUT MY DIABETES MEDICATION?   The day of surgery, do not take other diabetes injectables, including Trulicity (dulaglutide).   HOW TO MANAGE YOUR DIABETES BEFORE AND AFTER SURGERY  Why is it important to control my blood sugar before and after surgery? Improving blood sugar levels before and after surgery helps healing and can limit problems. A way of improving blood sugar control is eating a healthy diet by:  Eating less sugar and carbohydrates  Increasing activity/exercise  Talking with your doctor about reaching your blood sugar goals High blood sugars (greater than 180 mg/dL) can raise your risk of infections and slow your recovery, so you will need to focus on controlling your diabetes during the weeks before surgery. Make sure that the doctor who takes care of your diabetes knows  about your planned surgery including the date and location.  How do I manage my blood sugar before surgery? Check your blood sugar at least 4 times a day, starting 2 days before surgery, to make sure that the level is not too high or low.  Check your blood sugar the morning of your surgery when you wake up and every 2 hours until you get to the Short Stay unit.  If your blood sugar is less than 70 mg/dL, you will need to treat for low blood sugar: Do not take insulin. Treat a low blood sugar (less than 70 mg/dL) with  cup of clear juice (cranberry or apple), 4 glucose tablets, OR glucose gel. Recheck blood sugar in 15 minutes after treatment (to make sure it is greater than 70 mg/dL). If your blood sugar is not greater than 70 mg/dL on recheck, call [phone] for further instructions. Report your blood sugar to the short stay nurse when you get to Short Stay.  If you are admitted to the hospital after surgery: Your blood sugar will be checked by the staff and you will probably be given insulin after surgery (instead of oral diabetes medicines) to make sure you have good blood sugar levels. The goal for blood sugar control after surgery is 80-180 mg/dL.   DAY OF SURGERY:         Do not wear jewelry, makeup, or nail polish Do not wear lotions, powders, perfumes, or deodorant. Do not shave 48 hours prior to surgery.   Do not bring valuables to the hospital.  [facility] is not responsible for any belongings or valuables.  Do NOT Smoke (Tobacco/Vaping)  24 hours prior to your procedure  If you use a CPAP at night, you may bring your mask for your overnight stay.   Contacts, glasses, hearing aids, dentures or partials may not be worn into surgery, please bring cases for these belongings   For patients admitted to the hospital, discharge time will be determined by your treatment team.   Patients discharged the day of surgery will not be allowed to drive home, and someone  needs to stay with them for 24 hours.  NO VISITORS WILL BE ALLOWED IN PRE-OP WHERE PATIENTS ARE PREPPED FOR SURGERY.  ONLY 1 SUPPORT PERSON MAY BE PRESENT IN THE WAITING ROOM WHILE YOU ARE IN SURGERY.  IF YOU ARE TO BE ADMITTED, ONCE YOU ARE IN YOUR ROOM YOU WILL BE ALLOWED TWO (2) VISITORS. 1 (ONE) VISITOR MAY STAY OVERNIGHT BUT MUST ARRIVE TO THE ROOM BY 8pm.  Minor children may have two parents present. Special consideration for safety and communication needs will be reviewed on a case by case basis.  Special instructions:    Oral Hygiene is also important to reduce your risk of infection.  Remember - BRUSH YOUR TEETH THE MORNING OF SURGERY WITH YOUR REGULAR TOOTHPASTE   [facility]- Preparing For Surgery  Before surgery, you can play an important role. Because skin is not sterile, your skin needs to be as free of germs as possible. You can reduce the number of germs on your skin by washing with CHG (chlorahexidine gluconate) Soap before surgery.  CHG is an antiseptic cleaner which kills germs and bonds with the skin to continue killing germs even after washing.     Please do not use if you have an allergy to CHG or antibacterial soaps. If your skin becomes reddened/irritated stop using the CHG.  Do not shave (including legs and underarms) for at least 48 hours prior to first CHG shower. It is OK to shave your face.  Please follow these instructions carefully.     Shower the NIGHT BEFORE SURGERY and the MORNING OF SURGERY with CHG Soap.   If you chose to wash your hair, wash your hair first as usual with your normal shampoo. After you shampoo, rinse your hair and body thoroughly to remove the shampoo.  Then [employer] and genitals (private parts) with your normal soap and rinse thoroughly to remove soap.  After that Use CHG Soap as you would any other liquid soap. You can apply CHG directly to the skin and wash gently with a scrungie or a clean washcloth.   Apply the CHG Soap to your body  ONLY FROM THE NECK DOWN.  Do not use on open wounds or open sores. Avoid contact with your eyes, ears, mouth and genitals (private parts). Wash Face and genitals (private parts)  with your normal soap.   Wash thoroughly, paying special attention to the area where your surgery will be performed.  Thoroughly rinse your body with warm water from the neck down.  DO NOT shower/wash with your normal soap after using and rinsing off the CHG Soap.  Pat yourself dry with a CLEAN TOWEL.  Wear CLEAN PAJAMAS to bed the night before surgery  Place CLEAN SHEETS on your bed the night before your surgery  DO NOT SLEEP WITH PETS.   Day of Surgery:  Take a shower with CHG soap. Wear Clean/Comfortable clothing the morning of surgery Do not apply  any deodorants/lotions.   Remember to brush your teeth WITH YOUR REGULAR TOOTHPASTE.   Please read over the following fact sheets that you were given.

## 2021-09-28 ENCOUNTER — Encounter (HOSPITAL_COMMUNITY): Payer: Self-pay

## 2021-09-28 ENCOUNTER — Other Ambulatory Visit: Payer: Self-pay

## 2021-09-28 ENCOUNTER — Encounter (HOSPITAL_COMMUNITY)
Admission: RE | Admit: 2021-09-28 | Discharge: 2021-09-28 | Disposition: A | Discharge status: Home / Self Care | Payer: Managed Care, Other (non HMO) | Source: Ambulatory Visit | Attending: Surgery | Admitting: Surgery

## 2021-09-28 DIAGNOSIS — Z01812 Encounter for preprocedural laboratory examination: Secondary | ICD-10-CM | POA: Diagnosis not present

## 2021-09-28 LAB — GLUCOSE, CAPILLARY: Glucose-Capillary: 149 mg/dL — ABNORMAL HIGH (ref 70–99)

## 2021-09-28 NOTE — Progress Notes (Signed)
PCP - [facility] (pt cannot recall at this time) Cardiologist - denies  Chest x-ray - n/a EKG - 09/28/21 Stress Test - denies ECHO - denies Cardiac Cath - denies  Fasting Blood Sugar - 100-130s Checks Blood Sugar 1 time a day  Blood Thinner Instructions:  Aspirin Instructions:   ERAS Protcol - yes clears until 0830 DOS PRE-SURGERY Ensure or G2- G2  COVID TEST- n/a   Anesthesia review: yes - seed placement  Patient denies shortness of breath, fever, cough and chest pain at PAT appointment   All instructions explained to the patient, with a verbal understanding of the material. Patient agrees to go over the instructions while at home for a better understanding. Patient also instructed to self quarantine after being tested for COVID-19. The opportunity to ask questions was provided.

## 2021-09-29 ENCOUNTER — Encounter (HOSPITAL_COMMUNITY)
Admission: RE | Disposition: A | Discharge status: Home / Self Care | Payer: Self-pay | Source: Home / Self Care | Attending: Surgery

## 2021-09-29 ENCOUNTER — Encounter (HOSPITAL_COMMUNITY): Payer: Self-pay | Admitting: Surgery

## 2021-09-29 ENCOUNTER — Ambulatory Visit (HOSPITAL_COMMUNITY)
Admission: RE | Admit: 2021-09-29 | Discharge: 2021-09-29 | Disposition: A | Discharge status: Home / Self Care | Payer: Managed Care, Other (non HMO) | Attending: Surgery | Admitting: Surgery

## 2021-09-29 ENCOUNTER — Ambulatory Visit (HOSPITAL_COMMUNITY): Payer: Managed Care, Other (non HMO) | Admitting: Physician Assistant

## 2021-09-29 DIAGNOSIS — Z170 Estrogen receptor positive status [ER+]: Secondary | ICD-10-CM | POA: Diagnosis not present

## 2021-09-29 DIAGNOSIS — Z6841 Body Mass Index (BMI) 40.0 and over, adult: Secondary | ICD-10-CM | POA: Insufficient documentation

## 2021-09-29 DIAGNOSIS — D0582 Other specified type of carcinoma in situ of left breast: Secondary | ICD-10-CM | POA: Diagnosis present

## 2021-09-29 DIAGNOSIS — E669 Obesity, unspecified: Secondary | ICD-10-CM | POA: Insufficient documentation

## 2021-09-29 DIAGNOSIS — E119 Type 2 diabetes mellitus without complications: Secondary | ICD-10-CM | POA: Insufficient documentation

## 2021-09-29 DIAGNOSIS — I10 Essential (primary) hypertension: Secondary | ICD-10-CM | POA: Insufficient documentation

## 2021-09-29 DIAGNOSIS — D0512 Intraductal carcinoma in situ of left breast: Secondary | ICD-10-CM | POA: Diagnosis not present

## 2021-09-29 DIAGNOSIS — Z7984 Long term (current) use of oral hypoglycemic drugs: Secondary | ICD-10-CM | POA: Diagnosis not present

## 2021-09-29 HISTORY — PX: BREAST CYST EXCISION: SHX579

## 2021-09-29 HISTORY — PX: BREAST LUMPECTOMY WITH RADIOACTIVE SEED LOCALIZATION: SHX6424

## 2021-09-29 LAB — GLUCOSE, CAPILLARY
Glucose-Capillary: 109 mg/dL — ABNORMAL HIGH (ref 70–99)
Glucose-Capillary: 84 mg/dL (ref 70–99)
Glucose-Capillary: 80 mg/dL (ref 70–99)

## 2021-09-29 SURGERY — EXCISION, CYST, BREAST
Anesthesia: General | Site: Breast | Laterality: Left

## 2021-09-29 MED ORDER — ONDANSETRON HCL 4 MG/2ML IJ SOLN
INTRAMUSCULAR | Status: AC
  Filled 2021-09-29: qty 2

## 2021-09-29 MED ORDER — CHLORHEXIDINE GLUCONATE CLOTH 2 % EX PADS: 6.0000 | MEDICATED_PAD | Freq: Once | CUTANEOUS | Status: DC

## 2021-09-29 MED ORDER — PROMETHAZINE HCL 25 MG/ML IJ SOLN: 6.2500 mg | INTRAMUSCULAR | Status: DC | PRN

## 2021-09-29 MED ORDER — CHLORHEXIDINE GLUCONATE 0.12 % MT SOLN
15.0000 mL | Freq: Once | OROMUCOSAL | Status: AC
  Administered 2021-09-29: 15 mL via OROMUCOSAL
  Filled 2021-09-29: qty 15

## 2021-09-29 MED ORDER — MIDAZOLAM HCL 2 MG/2ML IJ SOLN
INTRAMUSCULAR | Status: AC
  Filled 2021-09-29: qty 2

## 2021-09-29 MED ORDER — LIDOCAINE 2% (20 MG/ML) 5 ML SYRINGE
INTRAMUSCULAR | Status: DC | PRN
  Administered 2021-09-29: 60 mg via INTRAVENOUS

## 2021-09-29 MED ORDER — PROPOFOL 10 MG/ML IV BOLUS
INTRAVENOUS | Status: AC
  Filled 2021-09-29: qty 20

## 2021-09-29 MED ORDER — KETOROLAC TROMETHAMINE 30 MG/ML IJ SOLN
INTRAMUSCULAR | Status: DC | PRN
  Administered 2021-09-29: 30 mg via INTRAVENOUS

## 2021-09-29 MED ORDER — PHENYLEPHRINE 40 MCG/ML (10ML) SYRINGE FOR IV PUSH (FOR BLOOD PRESSURE SUPPORT)
PREFILLED_SYRINGE | INTRAVENOUS | Status: AC
  Filled 2021-09-29: qty 30

## 2021-09-29 MED ORDER — OXYCODONE HCL 5 MG PO TABS
ORAL_TABLET | ORAL | Status: AC
  Filled 2021-09-29: qty 1

## 2021-09-29 MED ORDER — FENTANYL CITRATE (PF) 250 MCG/5ML IJ SOLN
INTRAMUSCULAR | Status: AC
  Filled 2021-09-29: qty 5

## 2021-09-29 MED ORDER — PHENYLEPHRINE 40 MCG/ML (10ML) SYRINGE FOR IV PUSH (FOR BLOOD PRESSURE SUPPORT)
PREFILLED_SYRINGE | INTRAVENOUS | Status: DC | PRN
  Administered 2021-09-29 (×7): 80 ug via INTRAVENOUS

## 2021-09-29 MED ORDER — HYDROMORPHONE HCL 1 MG/ML IJ SOLN: 0.2500 mg | INTRAMUSCULAR | Status: DC | PRN

## 2021-09-29 MED ORDER — ORAL CARE MOUTH RINSE: 15.0000 mL | Freq: Once | OROMUCOSAL | Status: AC

## 2021-09-29 MED ORDER — 0.9 % SODIUM CHLORIDE (POUR BTL) OPTIME
TOPICAL | Status: DC | PRN
  Administered 2021-09-29: 1000 mL

## 2021-09-29 MED ORDER — ONDANSETRON HCL 4 MG/2ML IJ SOLN
INTRAMUSCULAR | Status: DC | PRN
  Administered 2021-09-29: 4 mg via INTRAVENOUS

## 2021-09-29 MED ORDER — KETOROLAC TROMETHAMINE 30 MG/ML IJ SOLN
INTRAMUSCULAR | Status: AC
  Filled 2021-09-29: qty 1

## 2021-09-29 MED ORDER — OXYCODONE HCL 5 MG PO TABS
5.0000 mg | ORAL_TABLET | Freq: Once | ORAL | Status: AC | PRN
  Administered 2021-09-29: 5 mg via ORAL

## 2021-09-29 MED ORDER — PROPOFOL 10 MG/ML IV BOLUS
INTRAVENOUS | Status: DC | PRN
  Administered 2021-09-29: 20 mg via INTRAVENOUS
  Administered 2021-09-29: 30 mg via INTRAVENOUS
  Administered 2021-09-29: 200 mg via INTRAVENOUS

## 2021-09-29 MED ORDER — OXYCODONE HCL 5 MG/5ML PO SOLN: 5.0000 mg | Freq: Once | ORAL | Status: AC | PRN

## 2021-09-29 MED ORDER — BUPIVACAINE-EPINEPHRINE 0.25% -1:200000 IJ SOLN
INTRAMUSCULAR | Status: DC | PRN
  Administered 2021-09-29: 20 mL

## 2021-09-29 MED ORDER — EPHEDRINE 5 MG/ML INJ
INTRAVENOUS | Status: AC
  Filled 2021-09-29: qty 10

## 2021-09-29 MED ORDER — FENTANYL CITRATE (PF) 250 MCG/5ML IJ SOLN
INTRAMUSCULAR | Status: DC | PRN
  Administered 2021-09-29: 50 ug via INTRAVENOUS
  Administered 2021-09-29: 25 ug via INTRAVENOUS
  Administered 2021-09-29 (×2): 50 ug via INTRAVENOUS

## 2021-09-29 MED ORDER — AMISULPRIDE (ANTIEMETIC) 5 MG/2ML IV SOLN: 10.0000 mg | Freq: Once | INTRAVENOUS | Status: DC | PRN

## 2021-09-29 MED ORDER — DEXAMETHASONE SODIUM PHOSPHATE 10 MG/ML IJ SOLN
INTRAMUSCULAR | Status: DC | PRN
  Administered 2021-09-29: 5 mg via INTRAVENOUS

## 2021-09-29 MED ORDER — MIDAZOLAM HCL 2 MG/2ML IJ SOLN
INTRAMUSCULAR | Status: DC | PRN
  Administered 2021-09-29: 1 mg via INTRAVENOUS

## 2021-09-29 MED ORDER — KETOROLAC TROMETHAMINE 30 MG/ML IJ SOLN: 30.0000 mg | Freq: Once | INTRAMUSCULAR | Status: DC | PRN

## 2021-09-29 MED ORDER — LACTATED RINGERS IV SOLN: INTRAVENOUS | Status: DC

## 2021-09-29 MED ORDER — HYDROCODONE-ACETAMINOPHEN 5-325 MG PO TABS: 1.0000 | ORAL_TABLET | Freq: Four times a day (QID) | ORAL | 0 refills | Status: DC | PRN

## 2021-09-29 MED ORDER — CEFAZOLIN IN SODIUM CHLORIDE 3-0.9 GM/100ML-% IV SOLN
3.0000 g | INTRAVENOUS | Status: AC
  Administered 2021-09-29: 3 g via INTRAVENOUS
  Filled 2021-09-29: qty 100

## 2021-09-29 SURGICAL SUPPLY — 44 items
APL PRP STRL LF DISP 70% ISPRP (MISCELLANEOUS) ×1
APL SKNCLS STERI-STRIP NONHPOA (GAUZE/BANDAGES/DRESSINGS) ×1
APPLIER CLIP 9.375 MED OPEN (MISCELLANEOUS)
APR CLP MED 9.3 20 MLT OPN (MISCELLANEOUS)
BAG COUNTER SPONGE SURGICOUNT (BAG) ×2 IMPLANT
BAG SPNG CNTER NS LX DISP (BAG) ×1
BENZOIN TINCTURE PRP APPL 2/3 (GAUZE/BANDAGES/DRESSINGS) ×2 IMPLANT
BINDER BREAST LRG (GAUZE/BANDAGES/DRESSINGS) IMPLANT
BINDER BREAST XLRG (GAUZE/BANDAGES/DRESSINGS) IMPLANT
BINDER BREAST XXLRG (GAUZE/BANDAGES/DRESSINGS) ×1 IMPLANT
CANISTER SUCT 3000ML PPV (MISCELLANEOUS) ×2 IMPLANT
CHLORAPREP W/TINT 26 (MISCELLANEOUS) ×2 IMPLANT
CLIP APPLIE 9.375 MED OPEN (MISCELLANEOUS) IMPLANT
COVER PROBE W GEL 5X96 (DRAPES) ×2 IMPLANT
COVER SURGICAL LIGHT HANDLE (MISCELLANEOUS) ×2 IMPLANT
DEVICE DUBIN SPECIMEN MAMMOGRA (MISCELLANEOUS) ×2 IMPLANT
DRAPE CHEST BREAST 15X10 FENES (DRAPES) ×2 IMPLANT
DRSG TEGADERM 4X4.75 (GAUZE/BANDAGES/DRESSINGS) ×2 IMPLANT
ELECT CAUTERY BLADE 6.4 (BLADE) ×2 IMPLANT
ELECT REM PT RETURN 9FT ADLT (ELECTROSURGICAL) ×2
ELECTRODE REM PT RTRN 9FT ADLT (ELECTROSURGICAL) ×1 IMPLANT
GAUZE SPONGE 2X2 8PLY STRL LF (GAUZE/BANDAGES/DRESSINGS) ×1 IMPLANT
GLOVE SURG ENC MOIS LTX SZ7 (GLOVE) ×2 IMPLANT
GLOVE SURG UNDER POLY LF SZ7.5 (GLOVE) ×2 IMPLANT
GOWN STRL REUS W/ TWL LRG LVL3 (GOWN DISPOSABLE) ×2 IMPLANT
GOWN STRL REUS W/TWL LRG LVL3 (GOWN DISPOSABLE) ×4
ILLUMINATOR WAVEGUIDE N/F (MISCELLANEOUS) IMPLANT
KIT BASIN OR (CUSTOM PROCEDURE TRAY) ×2 IMPLANT
KIT MARKER MARGIN INK (KITS) ×2 IMPLANT
LIGHT WAVEGUIDE WIDE FLAT (MISCELLANEOUS) IMPLANT
NDL HYPO 25GX1X1/2 BEV (NEEDLE) ×1 IMPLANT
NEEDLE HYPO 25GX1X1/2 BEV (NEEDLE) ×2 IMPLANT
NS IRRIG 1000ML POUR BTL (IV SOLUTION) ×2 IMPLANT
PACK GENERAL/GYN (CUSTOM PROCEDURE TRAY) ×2 IMPLANT
SPONGE GAUZE 2X2 STER 10/PKG (GAUZE/BANDAGES/DRESSINGS) ×1
SPONGE T-LAP 4X18 ~~LOC~~+RFID (SPONGE) ×2 IMPLANT
STRIP CLOSURE SKIN 1/2X4 (GAUZE/BANDAGES/DRESSINGS) ×2 IMPLANT
SUT MNCRL AB 4-0 PS2 18 (SUTURE) ×2 IMPLANT
SUT SILK 2 0 SH (SUTURE) IMPLANT
SUT VIC AB 3-0 SH 27 (SUTURE) ×2
SUT VIC AB 3-0 SH 27X BRD (SUTURE) ×1 IMPLANT
SYR CONTROL 10ML LL (SYRINGE) ×2 IMPLANT
TOWEL GREEN STERILE (TOWEL DISPOSABLE) ×2 IMPLANT
TOWEL GREEN STERILE FF (TOWEL DISPOSABLE) ×2 IMPLANT

## 2021-09-29 NOTE — Anesthesia Procedure Notes (Signed)
Procedure Name: LMA Insertion Date/Time: 09/29/2021 12:49 PM Performed by: Trinna Post., CRNA Pre-anesthesia Checklist: Patient identified, Emergency Drugs available and Suction available Patient Re-evaluated:Patient Re-evaluated prior to induction Oxygen Delivery Method: Circle system utilized Preoxygenation: Pre-oxygenation with 100% oxygen Induction Type: IV induction LMA: LMA inserted LMA Size: 4.0 Number of attempts: 1 Placement Confirmation: positive ETCO2 and breath sounds checked- equal and bilateral Tube secured with: Tape Dental Injury: Teeth and Oropharynx as per pre-operative assessment

## 2021-09-29 NOTE — Transfer of Care (Signed)
Immediate Anesthesia Transfer of Care Note  Patient: Frances Cunningham  Procedure(s) Performed: LEFT CENTRAL BREAST EXCISION (Left: Breast) LEFT BREAST LUMPECTOMY WITH RADIOACTIVE SEED LOCALIZATION (Left: Breast)  Patient Location: PACU  Anesthesia Type:General  Level of Consciousness: awake and drowsy  Airway & Oxygen Therapy: Patient Spontanous Breathing and Patient connected to nasal cannula oxygen  Post-op Assessment: Report given to RN and Post -op Vital signs reviewed and stable  Post vital signs: Reviewed and stable  Last Vitals:  Vitals Value Taken Time  BP 138/82 09/29/21 1356  Temp    Pulse 83 09/29/21 1358  Resp 22 09/29/21 1358  SpO2 96 % 09/29/21 1358  Vitals shown include unvalidated device data.  Last Pain:  Vitals:   09/29/21 0958  TempSrc:   PainSc: 0-No pain         Complications: No notable events documented.

## 2021-09-29 NOTE — Anesthesia Postprocedure Evaluation (Signed)
Anesthesia Post Note  Patient: Frances Cunningham  Procedure(s) Performed: LEFT CENTRAL BREAST EXCISION (Left: Breast) LEFT BREAST LUMPECTOMY WITH RADIOACTIVE SEED LOCALIZATION (Left: Breast)     Patient location during evaluation: PACU Anesthesia Type: General Level of consciousness: awake and alert, oriented and patient cooperative Pain management: pain level controlled Vital Signs Assessment: post-procedure vital signs reviewed and stable Respiratory status: spontaneous breathing, nonlabored ventilation and respiratory function stable Cardiovascular status: blood pressure returned to baseline and stable Postop Assessment: no apparent nausea or vomiting Anesthetic complications: no   No notable events documented.  Last Vitals:  Vitals:   09/29/21 1411 09/29/21 1415  BP: 134/82   Pulse: 72 72  Resp: (!) 22 (!) 23  Temp:    SpO2: 96% 98%    Last Pain:  Vitals:   09/29/21 1356  TempSrc:   PainSc: [place]

## 2021-09-29 NOTE — Interval H&P Note (Signed)
History and Physical Interval Note:  09/29/2021 12:12 PM  Frances Cunningham  has presented today for surgery, with the diagnosis of LEFT BREAST DUCTAL CARCINOMA IN SITU.  The various methods of treatment have been discussed with the patient and family. After consideration of risks, benefits and other options for treatment, the patient has consented to  Procedure(s): LEFT CENTRAL BREAST EXCISION (Left) LEFT BREAST LUMPECTOMY WITH RADIOACTIVE SEED LOCALIZATION (Left) as a surgical intervention.  The patient's history has been reviewed, patient examined, no change in status, stable for surgery.  I have reviewed the patient's chart and labs.  Questions were answered to the patient's satisfaction.     Maia Petties

## 2021-09-29 NOTE — Discharge Instructions (Addendum)
[facility] Office Phone Number [phone]  BREAST BIOPSY/ PARTIAL MASTECTOMY: POST OP INSTRUCTIONS  Always review your discharge instruction sheet given to you by the facility where your surgery was performed.  IF YOU HAVE DISABILITY OR FAMILY LEAVE FORMS, YOU MUST BRING THEM TO THE OFFICE FOR PROCESSING.  DO NOT GIVE THEM TO YOUR DOCTOR.  A prescription for pain medication may be given to you upon discharge.  Take your pain medication as prescribed, if needed.  If narcotic pain medicine is not needed, then you may take acetaminophen (Tylenol) or ibuprofen (Advil) as needed. Take your usually prescribed medications unless otherwise directed If you need a refill on your pain medication, please contact your pharmacy.  They will contact our office to request authorization.  Prescriptions will not be filled after 5pm or on week-ends. You should eat very light the first 24 hours after surgery, such as soup, crackers, pudding, etc.  Resume your normal diet the day after surgery. Most patients will experience some swelling and bruising in the breast.  Ice packs and a good support bra will help.  Swelling and bruising can take several days to resolve.  It is common to experience some constipation if taking pain medication after surgery.  Increasing fluid intake and taking a stool softener will usually help or prevent this problem from occurring.  A mild laxative (Milk of Magnesia or Miralax) should be taken according to package directions if there are no bowel movements after 48 hours. Unless discharge instructions indicate otherwise, you may remove your bandages 24-48 hours after surgery, and you may shower at that time.  You may have steri-strips (small skin tapes) in place directly over the incision.  These strips should be left on the skin for 7-10 days.  If your surgeon used skin glue on the incision, you may shower in 24 hours.  The glue will flake off over the next 2-3 weeks.  Any  sutures or staples will be removed at the office during your follow-up visit. ACTIVITIES:  You may resume regular daily activities (gradually increasing) beginning the next day.  Wearing a good support bra or sports bra minimizes pain and swelling.  You may have sexual intercourse when it is comfortable. You may drive when you no longer are taking prescription pain medication, you can comfortably wear a seatbelt, and you can safely maneuver your car and apply brakes. RETURN TO WORK:  ______________________________________________________________________________________ You should see your doctor in the office for a follow-up appointment approximately two weeks after your surgery.  Your doctor's nurse will typically make your follow-up appointment when she calls you with your pathology report.  Expect your pathology report 2-3 business days after your surgery.  You may call to check if you do not hear from us after three days. OTHER INSTRUCTIONS: _______________________________________________________________________________________________ _____________________________________________________________________________________________________________________________________ _____________________________________________________________________________________________________________________________________ _____________________________________________________________________________________________________________________________________  WHEN TO CALL YOUR DOCTOR: Fever over 101.0 Nausea and/or vomiting. Extreme swelling or bruising. Continued bleeding from incision. Increased pain, redness, or drainage from the incision.  The clinic staff is available to answer your questions during regular business hours.  Please don't hesitate to call and ask to speak to one of the nurses for clinical concerns.  If you have a medical emergency, go to the nearest emergency room or call 911.  A surgeon from [facility] is always on call at the hospital.  For further questions, please visit [email or web address]   

## 2021-09-29 NOTE — Anesthesia Preprocedure Evaluation (Addendum)
Anesthesia Evaluation  Patient identified by MRN, date of birth, ID band Patient awake    Reviewed: Allergy & Precautions, NPO status , Patient's Chart, lab work & pertinent test results  Airway Mallampati: III  TM Distance: >3 FB Neck ROM: Full    Dental no notable dental hx. (+) Teeth Intact, Dental Advisory Given   Pulmonary  Snores at night, has had witnessed apneas, has never had sleep study   Pulmonary exam normal breath sounds clear to auscultation       Cardiovascular hypertension (146/68 in preop), Pt. on medications Normal cardiovascular exam Rhythm:Regular Rate:Normal     Neuro/Psych negative neurological ROS  negative psych ROS   GI/Hepatic negative GI ROS, Neg liver ROS,   Endo/Other  diabetes, Well Controlled, Type 2, Oral Hypoglycemic AgentsHypothyroidism Morbid obesityBMI 41 FS 107 Last a1c 6.6  Renal/GU Renal InsufficiencyRenal diseaseCr 1.02  negative genitourinary   Musculoskeletal negative musculoskeletal ROS (+)   Abdominal (+) + obese,   Peds  Hematology negative hematology ROS (+)   Anesthesia Other Findings L breast DCIS   Reproductive/Obstetrics negative OB ROS                            Anesthesia Physical Anesthesia Plan  ASA: 3  Anesthesia Plan: General   Post-op Pain Management:    Induction: Intravenous  PONV Risk Score and Plan: 3 and Ondansetron, Dexamethasone, Midazolam and Treatment may vary due to age or medical condition  Airway Management Planned: LMA  Additional Equipment: None  Intra-op Plan:   Post-operative Plan: Extubation in OR  Informed Consent: I have reviewed the patients History and Physical, chart, labs and discussed the procedure including the risks, benefits and alternatives for the proposed anesthesia with the patient or authorized representative who has indicated his/her understanding and acceptance.     Dental advisory  given  Plan Discussed with: CRNA  Anesthesia Plan Comments:         Anesthesia Quick Evaluation

## 2021-09-29 NOTE — Op Note (Addendum)
Pre-op Diagnosis:  Left breast ductal carcinoma in situ Post-op Diagnosis: same Procedure:  Left radioactive seed localized lumpectomy and central breast excison Magtrace injection Surgeon:  Maia Petties. Anesthesia:  GEN - LMA Indications:    This is a 60 year old female who recently underwent screening mammogram 08/12/21 that revealed some calcifications in the retroareolar left breast, a 0.7 cm mass at 1:00, and a 1 cm in the left subareolar area.  Biopsy was recommended.  Pathology showed DCIS with calcifications, ER/ PR positive.     The microcalcifications track up to the surface of the skin of the nipple.  The mass at 12:00 is farther from the nipple.  Description of procedure: The patient is brought to the operating room placed in supine position on the operating room table. After an adequate level of general anesthesia was obtained, her left breast was prepped with ChloraPrep and draped in sterile fashion. A timeout was taken to ensure the proper patient and proper procedure. We interrogated the breast with the neoprobe. We made a elliptical incision around the entire nipple-areolar complex after infiltrating with 0.25% Marcaine. Dissection was carried down in the breast tissue with cautery. We used the neoprobe to guide [place] towards the radioactive seed in the upper outer retroareolar region.  The seed identifies the deepest extent of the DCIS. We excised the entire central breast including the radioactive seed.  The specimen was removed and was oriented with a paint kit. Specimen mammogram showed the radioactive seed as well as both of the biopsy clips within the specimen. This was sent for pathologic examination. There is no residual radioactivity within the biopsy cavity. We inspected carefully for hemostasis. The wound was thoroughly irrigated. Clips were placed around the lumpectomy cavity. The wound was closed with a deep layer of 3-0 Vicryl and a subcuticular layer of 4-0 Monocryl.  Benzoin Steri-Strips were applied. I injected Magtrace under the skin in the left upper outer quadrant of the breast.  The patient was then extubated and brought to the recovery room in stable condition. All sponge, instrument, and needle counts are correct.  Imogene Burn. [name], MD, [facility]  General/ Trauma Surgery  09/29/2021 1:51 PM

## 2021-09-30 ENCOUNTER — Telehealth: Payer: Self-pay | Admitting: *Deleted

## 2021-09-30 ENCOUNTER — Encounter (HOSPITAL_COMMUNITY): Payer: Self-pay | Admitting: Surgery

## 2021-09-30 ENCOUNTER — Encounter: Payer: Self-pay | Admitting: *Deleted

## 2021-09-30 NOTE — Telephone Encounter (Signed)
Left vm regarding [facility]C from 10.26.22. Contact information provided for questions or needs. 

## 2021-10-01 ENCOUNTER — Telehealth: Payer: Self-pay | Admitting: Genetic Counselor

## 2021-10-01 ENCOUNTER — Encounter: Payer: Self-pay | Admitting: Genetic Counselor

## 2021-10-01 ENCOUNTER — Inpatient Hospital Stay
Admission: RE | Admit: 2021-10-01 | Discharge: 2021-10-01 | Disposition: A | Discharge status: Home / Self Care | Payer: Self-pay | Source: Ambulatory Visit | Attending: Radiation Oncology | Admitting: Radiation Oncology

## 2021-10-01 ENCOUNTER — Ambulatory Visit
Admission: RE | Admit: 2021-10-01 | Discharge: 2021-10-01 | Disposition: A | Discharge status: Home / Self Care | Payer: Self-pay | Source: Ambulatory Visit | Attending: Radiation Oncology | Admitting: Radiation Oncology

## 2021-10-01 ENCOUNTER — Other Ambulatory Visit: Payer: Self-pay | Admitting: Radiation Oncology

## 2021-10-01 DIAGNOSIS — D0512 Intraductal carcinoma in situ of left breast: Secondary | ICD-10-CM

## 2021-10-01 DIAGNOSIS — Z1379 Encounter for other screening for genetic and chromosomal anomalies: Secondary | ICD-10-CM | POA: Insufficient documentation

## 2021-10-01 NOTE — Telephone Encounter (Signed)
I contacted Frances Cunningham to discuss her genetic testing results. One pathogenic variant was identified in the MUTYH gene. 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 Of note, a variant of uncertain significance was identified in the APC gene.   The test report has been scanned into EPIC and is located under the Molecular Pathology section of the Results Review tab.  A portion of the result report is included below for reference. Detailed clinic note to follow.  [name], MS, [facility] Genetic Counselor [email or web address] (P) [phone]

## 2021-10-04 ENCOUNTER — Ambulatory Visit: Payer: Self-pay | Admitting: Genetic Counselor

## 2021-10-04 NOTE — Progress Notes (Signed)
HPI:   Frances Cunningham was previously seen in the [place] clinic due to a personal history of cancer and concerns regarding a hereditary predisposition to cancer. Please refer to our prior cancer genetics clinic note for more information regarding our discussion, assessment and recommendations, at the time. Frances Cunningham recent genetic test results were disclosed to her, as were recommendations warranted by these results. These results and recommendations are discussed in more detail below.  CANCER HISTORY:  Oncology History  Ductal carcinoma in situ (DCIS) of left breast (Resolved)  09/17/2021 Initial Diagnosis   Screening mammogram: indeterminate linear calcifications central to the nipple, 1 cm mass sub-areolar depth, and 0.7 cm mass at 1:00 in the left breast. Diagnostic mammogram and [place]: two 1 cm masses in the upper outer left breast and linear calcifications behind the nipple of the left breast.  Biopsy: DCIS with calcifications ER 100%, PR 40% early invasion cannot be ruled out   09/22/2021 Cancer Staging   Staging form: Breast, AJCC 8th Edition - Clinical: Stage 0 (cTis (DCIS), cN0, cM0, ER+, PR+, HER2: Not Assessed) - Signed by Nicholas Lose, MD on 09/22/2021 Stage prefix: Initial diagnosis      FAMILY HISTORY:  Frances Cunningham does not have any known family history of cancer and no family history of hereditary cancer genetic testing. There no reported Ashkenazi Jewish ancestry.      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 Specifically, this variant is [id number].[id number].  Genetic testing also identified a variant of uncertain significance (VUS) in the APC gene called p.T1633I.  At this time, it is unknown if this variant is associated with an increased risk for cancer or if it is benign, but most uncertain variants are reclassified to  benign. It should not be used to make medical management decisions. With time, we suspect the laboratory will determine the significance of this variant, if any. If the laboratory reclassifies this variant, we will attempt to contact Frances Cunningham to discuss it further.   The test report has been scanned into EPIC and is located under the Molecular Pathology section of the Results Review tab.  A portion of the result report is included below for reference. Genetic testing reported out on 09/30/2021.         Clinical Information: Changes in the MUTYH gene are associated with MUTYH-associated Polyposis syndrome (MAP). MAP is a hereditary cancer condition in which patients have high risks for colorectal cancer due to a large number of adenomatous polyps in the gastrointestinal system. MAP is unique among the hereditary cancer syndromes in that it is a "recessive" condition. Most hereditary cancer conditions are "dominant", meaning the condition is present in patients with a mutation in one copy of the gene. Patients have MAP only if there are mutations in both of their copies of the MUTYH gene. 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 It is estimated that 1% to 2% of the population has a mutation in one copy of the MUTYH gene. These individuals do not have MAP.   There is conflicting information regarding colon cancer risks for MUTYH carriers. Some studies have reported a 2-3 fold increased risk-- more so in individuals with a family history of colon cancer. Other studies have found no substantial evidence supporting MUTYH carriers having  increased risks of colon cancer. At this time, the [employer]  recommends screening for MUTYH carriers based on family history of colon cancer.  Research is continuing to help learn more about the cancers associated with MUTYH pathogenic variants and what the  exact risks are to develop these cancers.  Management Recommendations for individuals with a single MUTYH pathogenic variant:  Colon Cancer Screening: If an individual has a first-degree relative with colorectal cancer, screening should begin at age 45 or 37 years prior to the relative's age at diagnosis, whichever comes first and repeat colonoscopies every 5 years.   If an individual has no first-degree relatives with colorectal cancer, data is unclear if specialized screening is warranted. We advise these patients to follow their gastroenterologists recommendations.  This information is based on current understanding of the gene and may change in the future.   Implications for Family Members: Frances Cunningham first degree relatives are at a 50% risk for also having inherited the MUTYH mutation. Family members may consider genetic testing for this familial pathogenic variant. As there are generally no childhood cancer risks associated with pathogenic variants in the MUTYH gene, individuals in the family are not recommended to have testing until they reach at least 61 years of age. They may contact our office at [phone] for more information or to schedule an appointment.  Complimentary testing for the familial variant is available for 90 days from the report date.  Family members who live outside of the area are encouraged to find a genetic counselor in their area by visiting: [email or web address].   Additional Information: Even though a pathogenic variant was not identified that explains her personal history of cancer, possible explanations may include: There may be no hereditary risk for cancer in the family. The cancer in Frances Cunningham may be due to other genetic or environmental factors. There may be a gene mutation in one of these genes that current testing methods cannot detect, but that chance is small. There could be another gene that has not yet been discovered, or that  we have not yet tested, that is responsible for her personal history of cancer.   Therefore, it is important to remain in touch with cancer genetics in the future so that we can continue to offer Frances Cunningham the most up to date genetic testing.   ADDITIONAL GENETIC TESTING:  We discussed with Frances Cunningham that her genetic testing was fairly extensive.  If there are genes identified to increase cancer risk that can be analyzed in the future, we would be happy to discuss and coordinate this testing at that time.    FOLLOW-UP:  Cancer genetics is a rapidly advancing field and it is possible that new genetic tests will be appropriate for her and/or her family members in the future. We encouraged her to remain in contact with cancer genetics on an annual basis so we can update her personal and family histories and let her know of advances in cancer genetics that may benefit this family.   Our contact number was provided. Frances Cunningham questions were answered to her satisfaction, and she knows she is welcome to call us at anytime with additional questions or concerns.   [name], MS, [facility] Genetic Counselor [email or web address] (P) [phone]

## 2021-10-05 LAB — SURGICAL PATHOLOGY

## 2021-10-06 ENCOUNTER — Encounter: Payer: Self-pay | Admitting: *Deleted

## 2021-10-06 NOTE — Progress Notes (Signed)
Patient Care Team: [facility] [place] as PCP - Philomena Doheny, Paulette Blanch, RN as Oncology Nurse Navigator Rockwell Germany, RN as Oncology Nurse Navigator Donnie Mesa, MD as Consulting Physician (General Surgery) Nicholas Lose, MD as Consulting Physician (Hematology and Oncology) Eppie Gibson, MD as Attending Physician (Radiation Oncology)  DIAGNOSIS:    ICD-10-CM   1. Ductal carcinoma in situ (DCIS) of left breast  D05.12       SUMMARY OF ONCOLOGIC HISTORY: Oncology History  Ductal carcinoma in situ (DCIS) of left breast  09/17/2021 Initial Diagnosis   Screening mammogram: indeterminate linear calcifications central to the nipple, 1 cm mass sub-areolar depth, and 0.7 cm mass at 1:00 in the left breast. Diagnostic mammogram and [place]: two 1 cm masses in the upper outer left breast and linear calcifications behind the nipple of the left breast.  Biopsy: DCIS with calcifications ER 100%, PR 40% early invasion cannot be ruled out   09/22/2021 Cancer Staging   Staging form: Breast, AJCC 8th Edition - Clinical: Stage 0 (cTis (DCIS), cN0, cM0, ER+, PR+, HER2: Not Assessed) - Signed by Nicholas Lose, MD on 09/22/2021 Stage prefix: Initial diagnosis    09/29/2021 Surgery   Left lumpectomy: 1.5 cm Intermediate grade DCIS involves the superior margin, ER 100%, PR 40%     CHIEF COMPLIANT: Follow-up of left breast DCIS  INTERVAL HISTORY: Frances Cunningham is a 60 y.o. with above-mentioned history of left breast DCIS. She presents to the clinic today for follow-up.  She has done extremely well from surgery standpoint without any major pain or discomfort.  She stopped taking any pain medications.  ALLERGIES:  has No Known Allergies.  MEDICATIONS:  Current Outpatient Medications  Medication Sig Dispense Refill   acetaminophen (TYLENOL) 500 MG tablet Take 1,500 mg by mouth every 6 (six) hours as needed (pain.).     albuterol (VENTOLIN HFA) 108 (90 Base) MCG/ACT inhaler Inhale 2 puffs into  the lungs every 6 (six) hours as needed for wheezing or shortness of breath.     amLODipine (NORVASC) 5 MG tablet Take 5 mg by mouth in the morning.     Cholecalciferol (VITAMIN D3) 50 MCG (2000 UT) TABS Take 2,000 Units by mouth in the morning.     gabapentin (NEURONTIN) 100 MG capsule Take 100 mg by mouth 2 (two) times daily as needed for pain.     HYDROcodone-acetaminophen (NORCO/VICODIN) 5-325 MG tablet Take 1 tablet by mouth every 6 (six) hours as needed for moderate pain. 15 tablet 0   levothyroxine (SYNTHROID) 137 MCG tablet Take 137 mcg by mouth daily before breakfast.     meloxicam (MOBIC) 15 MG tablet Take 1 tablet (15 mg total) by mouth daily. Use as needed for back pain (Patient taking differently: Take 15 mg by mouth in the morning.) 90 tablet 0   traMADol (ULTRAM) 50 MG tablet Take 50 mg by mouth daily as needed (pain.).     TRULICITY 1.69 CV/8.9FY SOPN Inject 0.75 mg into the skin every Tuesday.     No current facility-administered medications for this visit.    PHYSICAL EXAMINATION: ECOG PERFORMANCE STATUS: 1 - Symptomatic but completely ambulatory  Vitals:   10/07/21 1420  BP: (!) 141/81  Pulse: 89  Resp: 18  Temp: 97.8 F (36.6 C)  SpO2: 99%   Filed Weights   10/07/21 1420  Weight: 269 lb 8 oz (122.2 kg)      LABORATORY DATA:  I have reviewed the data as listed CMP Latest Ref  Rng & Units 09/22/2021 04/09/2013  Glucose 70 - 99 mg/dL 150(H) 99  BUN 8 - 23 mg/dL 17 12  Creatinine 0.44 - 1.00 mg/dL 1.02(H) 1.08  Sodium 135 - 145 mmol/L 141 142  Potassium 3.5 - 5.1 mmol/L 3.8 4.0  Chloride 98 - 111 mmol/L 110 108  CO2 22 - 32 mmol/L 21(L) 24  Calcium 8.9 - 10.3 mg/dL 8.8(L) 8.7  Total Protein 6.5 - 8.1 g/dL 7.9 7.4  Total Bilirubin 0.3 - 1.2 mg/dL 0.7 0.6  Alkaline Phos 38 - 126 U/L 99 84  AST 15 - 41 U/L 17 16  ALT 0 - 44 U/L 21 15    Lab Results  Component Value Date   WBC 7.7 09/22/2021   HGB 13.9 09/22/2021   HCT 39.6 09/22/2021   MCV 86.1  09/22/2021   PLT 250 09/22/2021   NEUTROABS 5.0 09/22/2021    ASSESSMENT & PLAN:  Ductal carcinoma in situ (DCIS) of left breast 09/29/2021:Left lumpectomy: 1.5 cm Intermediate grade DCIS involves the superior margin, ER 100%, PR 40% Pathology counseling: I discussed the final pathology report of the patient provided  a copy of this report. I discussed the margins.  We also discussed the final staging along with previously performed ER/PR testing.  Treatment plan: Will discuss with Dr. [name] regarding the positive superior margin. 1.  Adjuvant radiation 2. followed by adjuvant antiestrogen therapy with tamoxifen x5 years  Return to clinic after radiation to start antiestrogen therapy    No orders of the defined types were placed in this encounter.  The patient has a good understanding of the overall plan. she agrees with it. she will call with any problems that may develop before the next visit here.  Total time spent: 20 mins including face to face time and time spent for planning, charting and coordination of care  Rulon Eisenmenger, MD, MPH 10/07/2021  I, Thana Ates, am acting as scribe for Dr. Nicholas Lose.  I have reviewed the above documentation for accuracy and completeness, and I agree with the above.

## 2021-10-07 ENCOUNTER — Inpatient Hospital Stay: Payer: Managed Care, Other (non HMO) | Attending: Hematology and Oncology | Admitting: Hematology and Oncology

## 2021-10-07 ENCOUNTER — Ambulatory Visit: Payer: Self-pay | Admitting: Surgery

## 2021-10-07 ENCOUNTER — Other Ambulatory Visit: Payer: Self-pay

## 2021-10-07 VITALS — BP 141/81 | HR 89 | Temp 97.8°F | Resp 18 | Ht 68.0 in | Wt 269.5 lb

## 2021-10-07 DIAGNOSIS — D0512 Intraductal carcinoma in situ of left breast: Secondary | ICD-10-CM | POA: Insufficient documentation

## 2021-10-07 NOTE — Assessment & Plan Note (Signed)
09/29/2021:Left lumpectomy: 1.5 cm Intermediate grade DCIS involves the superior margin, ER 100%, PR 40% Pathology counseling: I discussed the final pathology report of the patient provided  a copy of this report. I discussed the margins.  We also discussed the final staging along with previously performed ER/PR testing.  Treatment plan: 1.  Adjuvant radiation 2. followed by adjuvant antiestrogen therapy with tamoxifen x5 years  Return to clinic after radiation to start antiestrogen therapy

## 2021-10-11 ENCOUNTER — Encounter: Payer: Self-pay | Admitting: *Deleted

## 2021-10-15 ENCOUNTER — Encounter: Payer: Self-pay | Admitting: *Deleted

## 2021-10-15 NOTE — Progress Notes (Signed)
[place] CSW Progress Notes  Social Work Intern contacted patient by phone per [facility] follow up. Patient reports she is feeling well, although she will need a 2nd surgery at the end of the month. However, she reports having the support she needs and denies any resource/ financial concerns. She says the financial resource specialists have assisted her in handling her insurance and financial concerns.  Intern provided contact information and encouraged pt to reach out with any future concerns.  Rosary Lively, Social Work Intern Supervised by Gwinda Maine, LCSW

## 2021-10-25 ENCOUNTER — Other Ambulatory Visit: Payer: Self-pay

## 2021-10-25 ENCOUNTER — Encounter (HOSPITAL_COMMUNITY): Payer: Self-pay | Admitting: Surgery

## 2021-10-25 NOTE — Progress Notes (Signed)
SDW CALL  Patient was given pre-op instructions over the phone. The opportunity was given for the patient to ask questions. No further questions asked. Patient verbalized understanding of instructions given.   PCP - Dr. Tera Partridge Cardiologist - n/a  PPM/ICD - n/a Device Orders -  Rep Notified -   Chest x-ray -  EKG - 09/28/21 Stress Test -  ECHO -  Cardiac Cath -   Sleep Study - n/a CPAP -   Fasting Blood Sugar - 120-130 Checks Blood Sugar __2___ times a day  Blood Thinner Instructions: Aspirin Instructions:  ERAS Protcol - clears until 0945 PRE-SURGERY Ensure or G2- none ordered  COVID TEST- ambulatory surgery   Anesthesia review:   Patient denies shortness of breath, fever, cough and chest pain over the phone call   All instructions explained to the patient, with a verbal understanding of the material. Patient agrees to go over the instructions while at home for a better understanding. Patient also instructed to self quarantine after being tested for COVID-19. The opportunity to ask questions was provided.

## 2021-10-27 ENCOUNTER — Ambulatory Visit (HOSPITAL_COMMUNITY): Payer: Managed Care, Other (non HMO) | Admitting: Anesthesiology

## 2021-10-27 ENCOUNTER — Ambulatory Visit (HOSPITAL_COMMUNITY)
Admission: RE | Admit: 2021-10-27 | Discharge: 2021-10-27 | Disposition: A | Discharge status: Home / Self Care | Payer: Managed Care, Other (non HMO) | Attending: Surgery | Admitting: Surgery

## 2021-10-27 ENCOUNTER — Ambulatory Visit: Payer: Managed Care, Other (non HMO) | Admitting: Radiation Oncology

## 2021-10-27 ENCOUNTER — Encounter (HOSPITAL_COMMUNITY)
Admission: RE | Disposition: A | Discharge status: Home / Self Care | Payer: Self-pay | Source: Home / Self Care | Attending: Surgery

## 2021-10-27 ENCOUNTER — Encounter (HOSPITAL_COMMUNITY): Payer: Self-pay | Admitting: Surgery

## 2021-10-27 ENCOUNTER — Other Ambulatory Visit: Payer: Self-pay

## 2021-10-27 ENCOUNTER — Ambulatory Visit: Payer: Managed Care, Other (non HMO)

## 2021-10-27 DIAGNOSIS — Z6841 Body Mass Index (BMI) 40.0 and over, adult: Secondary | ICD-10-CM | POA: Insufficient documentation

## 2021-10-27 DIAGNOSIS — I10 Essential (primary) hypertension: Secondary | ICD-10-CM | POA: Insufficient documentation

## 2021-10-27 DIAGNOSIS — Z7984 Long term (current) use of oral hypoglycemic drugs: Secondary | ICD-10-CM | POA: Insufficient documentation

## 2021-10-27 DIAGNOSIS — E119 Type 2 diabetes mellitus without complications: Secondary | ICD-10-CM | POA: Diagnosis not present

## 2021-10-27 DIAGNOSIS — N641 Fat necrosis of breast: Secondary | ICD-10-CM | POA: Diagnosis not present

## 2021-10-27 DIAGNOSIS — D0512 Intraductal carcinoma in situ of left breast: Secondary | ICD-10-CM | POA: Diagnosis not present

## 2021-10-27 HISTORY — PX: RE-EXCISION OF BREAST LUMPECTOMY: SHX6048

## 2021-10-27 LAB — BASIC METABOLIC PANEL
Anion gap: 9 (ref 5–15)
BUN: 13 mg/dL (ref 8–23)
CO2: 21 mmol/L — ABNORMAL LOW (ref 22–32)
Calcium: 8.7 mg/dL — ABNORMAL LOW (ref 8.9–10.3)
Chloride: 106 mmol/L (ref 98–111)
Creatinine, Ser: 1.17 mg/dL — ABNORMAL HIGH (ref 0.44–1.00)
GFR, Estimated: 53 mL/min — ABNORMAL LOW (ref >60)
Glucose, Bld: 144 mg/dL — ABNORMAL HIGH (ref 70–99)
Potassium: 4.4 mmol/L (ref 3.5–5.1)
Sodium: 136 mmol/L (ref 135–145)

## 2021-10-27 LAB — CBC
HCT: 42.5 % (ref 36.0–46.0)
Hemoglobin: 14.5 g/dL (ref 12.0–15.0)
MCH: 30.7 pg (ref 26.0–34.0)
MCHC: 34.1 g/dL (ref 30.0–36.0)
MCV: 89.9 fL (ref 80.0–100.0)
Platelets: 284 10*3/uL (ref 150–400)
RBC: 4.73 MIL/uL (ref 3.87–5.11)
RDW: 13.2 % (ref 11.5–15.5)
WBC: 7.1 10*3/uL (ref 4.0–10.5)
nRBC: 0 % (ref 0.0–0.2)

## 2021-10-27 LAB — GLUCOSE, CAPILLARY
Glucose-Capillary: 165 mg/dL — ABNORMAL HIGH (ref 70–99)
Glucose-Capillary: 121 mg/dL — ABNORMAL HIGH (ref 70–99)

## 2021-10-27 SURGERY — EXCISION, LESION, BREAST
Anesthesia: General | Site: Breast | Laterality: Left

## 2021-10-27 MED ORDER — PHENYLEPHRINE 40 MCG/ML (10ML) SYRINGE FOR IV PUSH (FOR BLOOD PRESSURE SUPPORT)
PREFILLED_SYRINGE | INTRAVENOUS | Status: AC
  Filled 2021-10-27: qty 10

## 2021-10-27 MED ORDER — ONDANSETRON HCL 4 MG/2ML IJ SOLN
INTRAMUSCULAR | Status: AC
  Filled 2021-10-27: qty 2

## 2021-10-27 MED ORDER — LACTATED RINGERS IV SOLN: INTRAVENOUS | Status: DC

## 2021-10-27 MED ORDER — ONDANSETRON HCL 4 MG/2ML IJ SOLN
INTRAMUSCULAR | Status: DC | PRN
  Administered 2021-10-27: 4 mg via INTRAVENOUS

## 2021-10-27 MED ORDER — ORAL CARE MOUTH RINSE: 15.0000 mL | Freq: Once | OROMUCOSAL | Status: AC

## 2021-10-27 MED ORDER — 0.9 % SODIUM CHLORIDE (POUR BTL) OPTIME
TOPICAL | Status: DC | PRN
  Administered 2021-10-27: 200 mL

## 2021-10-27 MED ORDER — FENTANYL CITRATE (PF) 250 MCG/5ML IJ SOLN
INTRAMUSCULAR | Status: AC
  Filled 2021-10-27: qty 5

## 2021-10-27 MED ORDER — DEXAMETHASONE SODIUM PHOSPHATE 4 MG/ML IJ SOLN
INTRAMUSCULAR | Status: DC | PRN
  Administered 2021-10-27: 4 mg via INTRAVENOUS

## 2021-10-27 MED ORDER — MIDAZOLAM HCL 2 MG/2ML IJ SOLN
INTRAMUSCULAR | Status: AC
  Filled 2021-10-27: qty 2

## 2021-10-27 MED ORDER — PHENYLEPHRINE 40 MCG/ML (10ML) SYRINGE FOR IV PUSH (FOR BLOOD PRESSURE SUPPORT)
PREFILLED_SYRINGE | INTRAVENOUS | Status: DC | PRN
  Administered 2021-10-27: 80 ug via INTRAVENOUS
  Administered 2021-10-27: 120 ug via INTRAVENOUS
  Administered 2021-10-27 (×2): 80 ug via INTRAVENOUS

## 2021-10-27 MED ORDER — HYDROCODONE-ACETAMINOPHEN 5-325 MG PO TABS: 1.0000 | ORAL_TABLET | Freq: Four times a day (QID) | ORAL | 0 refills | Status: DC | PRN

## 2021-10-27 MED ORDER — FENTANYL CITRATE (PF) 100 MCG/2ML IJ SOLN
INTRAMUSCULAR | Status: DC | PRN
  Administered 2021-10-27: 150 ug via INTRAVENOUS

## 2021-10-27 MED ORDER — EPHEDRINE SULFATE-NACL 50-0.9 MG/10ML-% IV SOSY
PREFILLED_SYRINGE | INTRAVENOUS | Status: DC | PRN
  Administered 2021-10-27: 10 mg via INTRAVENOUS
  Administered 2021-10-27: 5 mg via INTRAVENOUS

## 2021-10-27 MED ORDER — LIDOCAINE 2% (20 MG/ML) 5 ML SYRINGE
INTRAMUSCULAR | Status: DC | PRN
  Administered 2021-10-27: 100 mg via INTRAVENOUS

## 2021-10-27 MED ORDER — MIDAZOLAM HCL 5 MG/5ML IJ SOLN
INTRAMUSCULAR | Status: DC | PRN
  Administered 2021-10-27: 1 mg via INTRAVENOUS

## 2021-10-27 MED ORDER — CHLORHEXIDINE GLUCONATE 0.12 % MT SOLN
15.0000 mL | Freq: Once | OROMUCOSAL | Status: AC
  Administered 2021-10-27: 15 mL via OROMUCOSAL
  Filled 2021-10-27: qty 15

## 2021-10-27 MED ORDER — PROPOFOL 10 MG/ML IV BOLUS
INTRAVENOUS | Status: AC
  Filled 2021-10-27: qty 20

## 2021-10-27 MED ORDER — CHLORHEXIDINE GLUCONATE CLOTH 2 % EX PADS: 6.0000 | MEDICATED_PAD | Freq: Once | CUTANEOUS | Status: DC

## 2021-10-27 MED ORDER — LIDOCAINE 2% (20 MG/ML) 5 ML SYRINGE
INTRAMUSCULAR | Status: AC
  Filled 2021-10-27: qty 5

## 2021-10-27 MED ORDER — CEFAZOLIN SODIUM-DEXTROSE 2-4 GM/100ML-% IV SOLN
2.0000 g | INTRAVENOUS | Status: AC
  Administered 2021-10-27: 2 g via INTRAVENOUS
  Filled 2021-10-27: qty 100

## 2021-10-27 MED ORDER — HYDROCODONE-ACETAMINOPHEN 5-325 MG PO TABS
1.0000 | ORAL_TABLET | Freq: Once | ORAL | Status: AC
  Administered 2021-10-27: 1 via ORAL

## 2021-10-27 MED ORDER — AMISULPRIDE (ANTIEMETIC) 5 MG/2ML IV SOLN: 10.0000 mg | Freq: Once | INTRAVENOUS | Status: DC | PRN

## 2021-10-27 MED ORDER — BUPIVACAINE HCL (PF) 0.25 % IJ SOLN
INTRAMUSCULAR | Status: DC | PRN
  Administered 2021-10-27: 10 mL

## 2021-10-27 MED ORDER — HYDROCODONE-ACETAMINOPHEN 5-325 MG PO TABS
ORAL_TABLET | ORAL | Status: AC
  Filled 2021-10-27: qty 1

## 2021-10-27 MED ORDER — DEXAMETHASONE SODIUM PHOSPHATE 10 MG/ML IJ SOLN
INTRAMUSCULAR | Status: AC
  Filled 2021-10-27: qty 1

## 2021-10-27 MED ORDER — ACETAMINOPHEN 500 MG PO TABS
1000.0000 mg | ORAL_TABLET | ORAL | Status: AC
  Administered 2021-10-27: 1000 mg via ORAL
  Filled 2021-10-27: qty 2

## 2021-10-27 MED ORDER — PROPOFOL 10 MG/ML IV BOLUS
INTRAVENOUS | Status: DC | PRN
  Administered 2021-10-27: 200 mg via INTRAVENOUS

## 2021-10-27 MED ORDER — FENTANYL CITRATE (PF) 100 MCG/2ML IJ SOLN: 25.0000 ug | INTRAMUSCULAR | Status: DC | PRN

## 2021-10-27 SURGICAL SUPPLY — 43 items
APL PRP STRL LF DISP 70% ISPRP (MISCELLANEOUS) ×1
APL SKNCLS STERI-STRIP NONHPOA (GAUZE/BANDAGES/DRESSINGS) ×1
APPLIER CLIP 9.375 MED OPEN (MISCELLANEOUS) ×2
APR CLP MED 9.3 20 MLT OPN (MISCELLANEOUS) ×1
BAG COUNTER SPONGE SURGICOUNT (BAG) ×2 IMPLANT
BAG SPNG CNTER NS LX DISP (BAG) ×1
BENZOIN TINCTURE PRP APPL 2/3 (GAUZE/BANDAGES/DRESSINGS) ×2 IMPLANT
BINDER BREAST LRG (GAUZE/BANDAGES/DRESSINGS) IMPLANT
BINDER BREAST XLRG (GAUZE/BANDAGES/DRESSINGS) IMPLANT
CANISTER SUCT 3000ML PPV (MISCELLANEOUS) ×2 IMPLANT
CHLORAPREP W/TINT 26 (MISCELLANEOUS) ×2 IMPLANT
CLIP APPLIE 9.375 MED OPEN (MISCELLANEOUS) IMPLANT
COVER PROBE W GEL 5X96 (DRAPES) ×1 IMPLANT
COVER SURGICAL LIGHT HANDLE (MISCELLANEOUS) ×2 IMPLANT
DEVICE DUBIN SPECIMEN MAMMOGRA (MISCELLANEOUS) ×1 IMPLANT
DRAPE CHEST BREAST 15X10 FENES (DRAPES) ×2 IMPLANT
DRSG TEGADERM 4X4.75 (GAUZE/BANDAGES/DRESSINGS) ×1 IMPLANT
ELECT CAUTERY BLADE 6.4 (BLADE) ×2 IMPLANT
ELECT REM PT RETURN 9FT ADLT (ELECTROSURGICAL) ×2
ELECTRODE REM PT RTRN 9FT ADLT (ELECTROSURGICAL) ×1 IMPLANT
GAUZE SPONGE 2X2 8PLY STRL LF (GAUZE/BANDAGES/DRESSINGS) ×1 IMPLANT
GLOVE SURG ENC MOIS LTX SZ7 (GLOVE) ×2 IMPLANT
GLOVE SURG UNDER POLY LF SZ7.5 (GLOVE) ×2 IMPLANT
GOWN STRL REUS W/ TWL LRG LVL3 (GOWN DISPOSABLE) ×2 IMPLANT
GOWN STRL REUS W/TWL LRG LVL3 (GOWN DISPOSABLE) ×6
ILLUMINATOR WAVEGUIDE N/F (MISCELLANEOUS) IMPLANT
KIT BASIN OR (CUSTOM PROCEDURE TRAY) ×2 IMPLANT
KIT MARKER MARGIN INK (KITS) ×2 IMPLANT
LIGHT WAVEGUIDE WIDE FLAT (MISCELLANEOUS) IMPLANT
NDL HYPO 25GX1X1/2 BEV (NEEDLE) ×1 IMPLANT
NEEDLE HYPO 25GX1X1/2 BEV (NEEDLE) ×2 IMPLANT
NS IRRIG 1000ML POUR BTL (IV SOLUTION) ×2 IMPLANT
PACK GENERAL/GYN (CUSTOM PROCEDURE TRAY) ×2 IMPLANT
SPONGE GAUZE 2X2 STER 10/PKG (GAUZE/BANDAGES/DRESSINGS)
SPONGE T-LAP 4X18 ~~LOC~~+RFID (SPONGE) ×2 IMPLANT
STRIP CLOSURE SKIN 1/2X4 (GAUZE/BANDAGES/DRESSINGS) ×2 IMPLANT
SUT MNCRL AB 4-0 PS2 18 (SUTURE) ×2 IMPLANT
SUT SILK 2 0 SH (SUTURE) IMPLANT
SUT VIC AB 3-0 SH 27 (SUTURE) ×2
SUT VIC AB 3-0 SH 27X BRD (SUTURE) ×1 IMPLANT
SYR CONTROL 10ML LL (SYRINGE) ×2 IMPLANT
TOWEL GREEN STERILE (TOWEL DISPOSABLE) ×1 IMPLANT
TOWEL GREEN STERILE FF (TOWEL DISPOSABLE) ×2 IMPLANT

## 2021-10-27 NOTE — Anesthesia Preprocedure Evaluation (Addendum)
Anesthesia Evaluation  Patient identified by MRN, date of birth, ID band Patient awake    Reviewed: Allergy & Precautions, NPO status , Patient's Chart, lab work & pertinent test results  Airway Mallampati: III  TM Distance: >3 FB Neck ROM: Full    Dental no notable dental hx. (+) Teeth Intact, Dental Advisory Given   Pulmonary  Snores at night, has had witnessed apneas, has never had sleep study   Pulmonary exam normal breath sounds clear to auscultation       Cardiovascular hypertension, Pt. on medications Normal cardiovascular exam Rhythm:Regular Rate:Normal     Neuro/Psych negative neurological ROS  negative psych ROS   GI/Hepatic negative GI ROS, Neg liver ROS,   Endo/Other  diabetes, Well Controlled, Type 2, Oral Hypoglycemic AgentsHypothyroidism Morbid obesityBMI 41 FS 107 Last a1c 6.6  Renal/GU Renal InsufficiencyRenal diseaseCr 1.02     Musculoskeletal   Abdominal (+) + obese,   Peds  Hematology negative hematology ROS (+)   Anesthesia Other Findings L breast DCIS   Reproductive/Obstetrics negative OB ROS                             Lab Results  Component Value Date   WBC 7.7 09/22/2021   HGB 13.9 09/22/2021   HCT 39.6 09/22/2021   MCV 86.1 09/22/2021   PLT 250 09/22/2021   Lab Results  Component Value Date   CREATININE 1.02 (H) 09/22/2021   BUN 17 09/22/2021   NA 141 09/22/2021   K 3.8 09/22/2021   CL 110 09/22/2021   CO2 21 (L) 09/22/2021    Anesthesia Physical  Anesthesia Plan  ASA: 3  Anesthesia Plan: General   Post-op Pain Management: Tylenol PO (pre-op), Minimal or no pain anticipated and Toradol IV (intra-op)   Induction: Intravenous  PONV Risk Score and Plan: 3 and Ondansetron, Dexamethasone, Midazolam and Treatment may vary due to age or medical condition  Airway Management Planned: LMA  Additional Equipment: None  Intra-op Plan:    Post-operative Plan: Extubation in OR  Informed Consent: I have reviewed the patients History and Physical, chart, labs and discussed the procedure including the risks, benefits and alternatives for the proposed anesthesia with the patient or authorized representative who has indicated his/her understanding and acceptance.     Dental advisory given  Plan Discussed with: CRNA  Anesthesia Plan Comments:        Anesthesia Quick Evaluation

## 2021-10-27 NOTE — Discharge Instructions (Signed)
[facility] Office Phone Number [phone]  BREAST BIOPSY/ PARTIAL MASTECTOMY: POST OP INSTRUCTIONS  Always review your discharge instruction sheet given to you by the facility where your surgery was performed.  IF YOU HAVE DISABILITY OR FAMILY LEAVE FORMS, YOU MUST BRING THEM TO THE OFFICE FOR PROCESSING.  DO NOT GIVE THEM TO YOUR DOCTOR.  A prescription for pain medication may be given to you upon discharge.  Take your pain medication as prescribed, if needed.  If narcotic pain medicine is not needed, then you may take acetaminophen (Tylenol) or ibuprofen (Advil) as needed. Take your usually prescribed medications unless otherwise directed If you need a refill on your pain medication, please contact your pharmacy.  They will contact our office to request authorization.  Prescriptions will not be filled after 5pm or on week-ends. You should eat very light the first 24 hours after surgery, such as soup, crackers, pudding, etc.  Resume your normal diet the day after surgery. Most patients will experience some swelling and bruising in the breast.  Ice packs and a good support bra will help.  Swelling and bruising can take several days to resolve.  It is common to experience some constipation if taking pain medication after surgery.  Increasing fluid intake and taking a stool softener will usually help or prevent this problem from occurring.  A mild laxative (Milk of Magnesia or Miralax) should be taken according to package directions if there are no bowel movements after 48 hours. Unless discharge instructions indicate otherwise, you may remove your bandages 24-48 hours after surgery, and you may shower at that time.  You may have steri-strips (small skin tapes) in place directly over the incision.  These strips should be left on the skin for 7-10 days.  If your surgeon used skin glue on the incision, you may shower in 24 hours.  The glue will flake off over the next 2-3 weeks.  Any  sutures or staples will be removed at the office during your follow-up visit. ACTIVITIES:  You may resume regular daily activities (gradually increasing) beginning the next day.  Wearing a good support bra or sports bra minimizes pain and swelling.  You may have sexual intercourse when it is comfortable. You may drive when you no longer are taking prescription pain medication, you can comfortably wear a seatbelt, and you can safely maneuver your car and apply brakes. RETURN TO WORK:  ______________________________________________________________________________________ You should see your doctor in the office for a follow-up appointment approximately two weeks after your surgery.  Your doctor's nurse will typically make your follow-up appointment when she calls you with your pathology report.  Expect your pathology report 2-3 business days after your surgery.  You may call to check if you do not hear from us after three days. OTHER INSTRUCTIONS: _______________________________________________________________________________________________ _____________________________________________________________________________________________________________________________________ _____________________________________________________________________________________________________________________________________ _____________________________________________________________________________________________________________________________________  WHEN TO CALL YOUR DOCTOR: Fever over 101.0 Nausea and/or vomiting. Extreme swelling or bruising. Continued bleeding from incision. Increased pain, redness, or drainage from the incision.  The clinic staff is available to answer your questions during regular business hours.  Please don't hesitate to call and ask to speak to one of the nurses for clinical concerns.  If you have a medical emergency, go to the nearest emergency room or call 911.  A surgeon from [facility] is always on call at the hospital.  For further questions, please visit [email or web address]   

## 2021-10-27 NOTE — Op Note (Signed)
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  She had 2 separate lesions that were located just behind the areola.  She underwent central breast excision and lumpectomy on 09/29/2021.  Pathology revealed no sign of invasive cancer.  However the superior margin was positive.  She presents now for reexcision of the superior margin.  Description of procedure: The patient is brought to the operating room placed in the supine position on the operating room table.  After an adequate level of general anesthesia was obtained, her left breast was prepped with ChloraPrep and draped in sterile fashion.  A timeout was taken to ensure the proper patient and proper procedure.  We infiltrated local anesthetic around the previous incision.  I opened up the center of the incision.  We dissected down through the subcutaneous tissues until we encountered the previous lumpectomy cavity.  We evacuated the seroma entirely.  I then excised another 1.5 cm thickness of the superior margin.  We oriented this specimen with the paint kit.  We irrigated thoroughly and inspected for hemostasis.  Clips were placed to mark the margins for radiation therapy.  The wound was closed with a deep layer of 3-0 Vicryl and a subcuticular layer 4-0 Monocryl.  Benzoin and Steri-Strips were applied.  The patient was then extubated and brought to the recovery room in stable condition.  All sponge, instrument, and needle counts are correct.  Imogene Burn. [name], MD, [facility]  General Surgery   10/27/2021 1:03 PM

## 2021-10-27 NOTE — H&P (Signed)
Frances Cunningham is an 60 y.o. female.   Chief Complaint: Left breast DCIS HPI:  Breast [place] 10/26 - [place]/ [place]   This is a 60 year old female who recently underwent screening mammogram 08/12/21 that revealed some calcifications in the retroareolar left breast, a 0.7 cm mass at 1:00, and a 1 cm in the left subareolar area.  Biopsy was recommended.  Pathology showed DCIS with calcifications, ER/ PR positive.     The microcalcifications track up to the surface of the skin of the nipple.  The mass at 12:00 is farther from the nipple.   No family history of breast cancer.  On 09/29/21, she underwent left lumpectomy and central breast excision.  Pathology showed only DCIS, but the superior margin is positive.  She presents now for reexcision.  Past Medical History:  Diagnosis Date   Breast cancer ([place])    Diabetes mellitus without complication ([place])    Hypertension    Thyroid disease     Past Surgical History:  Procedure Laterality Date   APPENDECTOMY     BREAST CYST EXCISION Left 09/29/2021   Procedure: LEFT CENTRAL BREAST EXCISION;  Surgeon: Donnie Mesa, MD;  Location: [place];  Service: General;  Laterality: Left;   BREAST LUMPECTOMY WITH RADIOACTIVE SEED LOCALIZATION Left 09/29/2021   Procedure: LEFT BREAST LUMPECTOMY WITH RADIOACTIVE SEED LOCALIZATION;  Surgeon: Donnie Mesa, MD;  Location: [place];  Service: General;  Laterality: Left;   CESAREAN SECTION     THYROIDECTOMY, PARTIAL     TUBAL LIGATION      History reviewed. No pertinent family history. Social History:  reports that she has never smoked. She has never used smokeless tobacco. She reports that she does not drink alcohol and does not use drugs.  Allergies: No Known Allergies  Medications Prior to Admission  Medication Sig Dispense Refill   acetaminophen (TYLENOL) 500 MG tablet Take 1,500 mg by mouth every 6 (six) hours as needed (pain.).     albuterol (VENTOLIN HFA) 108 (90 Base) MCG/ACT inhaler Inhale  2 puffs into the lungs every 6 (six) hours as needed for wheezing or shortness of breath.     amLODipine (NORVASC) 5 MG tablet Take 5 mg by mouth in the morning.     Cholecalciferol (VITAMIN D3) 50 MCG (2000 UT) TABS Take 2,000 Units by mouth in the morning.     gabapentin (NEURONTIN) 100 MG capsule Take 100 mg by mouth 2 (two) times daily as needed for pain.     HYDROcodone-acetaminophen (NORCO/VICODIN) 5-325 MG tablet Take 1 tablet by mouth every 6 (six) hours as needed for moderate pain.     levothyroxine (SYNTHROID) 137 MCG tablet Take 137 mcg by mouth daily before breakfast.     meloxicam (MOBIC) 15 MG tablet Take 1 tablet (15 mg total) by mouth daily. Use as needed for back pain (Patient taking differently: Take 15 mg by mouth daily as needed for pain.) 90 tablet 0   traMADol (ULTRAM) 50 MG tablet Take 50 mg by mouth daily as needed for moderate pain.     TRULICITY 0.63 KZ/6.0FU SOPN Inject 0.75 mg into the skin every Wednesday.      Results for orders placed or performed during the hospital encounter of 10/27/21 (from the past 48 hour(s))  Glucose, capillary     Status: Abnormal   Collection Time: 10/27/21 10:27 AM  Result Value Ref Range   Glucose-Capillary 165 (H) 70 - 99 mg/dL    Comment: Glucose reference  range applies only to samples taken after fasting for at least 8 hours.   No results found.  Review of Systems Constitutional: Negative.   HENT: Negative.   Eyes: Negative.   Respiratory: Negative.   Cardiovascular: Negative.   Gastrointestinal: Negative.   Genitourinary: Negative.   Musculoskeletal: Negative.   Skin: Negative.   Neurological: Negative.   Endo/Heme/Allergies: Negative.   Psychiatric/Behavioral: Negative.    Blood pressure (!) 158/89, pulse 83, temperature 98.8 F (37.1 C), temperature source Oral, resp. rate 18, height 5\' 8"  (1.727 m), weight 121.6 kg, last menstrual period 12/01/2016, SpO2 96 %. Physical Exam  Constitutional:  WDWN in NAD,  conversant, no obvious deformities; lying in bed comfortably Eyes:  Pupils equal, round; sclera anicteric; moist conjunctiva; no lid lag HENT:  Oral mucosa moist; good dentition  Neck:  No masses palpated, trachea midline; no thyromegaly Lungs:  CTA bilaterally; normal respiratory effort Breasts:  symmetric, no nipple changes; no lymphadenopathy on either side; left breast incision; no right breast masses CV:  Regular rate and rhythm; no murmurs; extremities well-perfused with no edema Abd:  +bowel sounds, soft, non-tender, no palpable organomegaly; no palpable hernias Musc:  Unable to assess gait; no apparent clubbing or cyanosis in extremities Lymphatic:  No palpable cervical or axillary lymphadenopathy Skin:  Warm, dry; no sign of jaundice Psychiatric - alert and oriented x 4; calm mood and affect   Assessment/Plan DCIS left breast - positive superior margin  Reexcision of left superior margin - lumpectomy.  The surgical procedure has been discussed with the patient.  Potential risks, benefits, alternative treatments, and expected outcomes have been explained.  All of the patient's questions at this time have been answered.  The likelihood of reaching the patient's treatment goal is good.  The patient understand the proposed surgical procedure and wishes to proceed.   Maia Petties, MD 10/27/2021, 10:33 AM

## 2021-10-27 NOTE — Anesthesia Procedure Notes (Signed)
Procedure Name: LMA Insertion Date/Time: 10/27/2021 12:31 PM Performed by: Lieutenant Diego, CRNA Pre-anesthesia Checklist: Patient identified, Emergency Drugs available, Suction available and Patient being monitored Patient Re-evaluated:Patient Re-evaluated prior to induction Oxygen Delivery Method: Circle system utilized Preoxygenation: Pre-oxygenation with 100% oxygen Induction Type: IV induction Ventilation: Mask ventilation without difficulty LMA: LMA inserted LMA Size: 4.0 Number of attempts: 1 Placement Confirmation: positive ETCO2 and breath sounds checked- equal and bilateral Tube secured with: Tape Dental Injury: Teeth and Oropharynx as per pre-operative assessment

## 2021-10-27 NOTE — Transfer of Care (Signed)
Immediate Anesthesia Transfer of Care Note  Patient: Frances Cunningham  Procedure(s) Performed: RE-EXCISION OFSUPERIOR MARGIN  LEFT LUMPECTOMY SITE (Left: Breast)  Patient Location: PACU  Anesthesia Type:General  Level of Consciousness: awake and alert   Airway & Oxygen Therapy: Patient Spontanous Breathing and Patient connected to face mask oxygen  Post-op Assessment: Report given to RN and Post -op Vital signs reviewed and stable  Post vital signs: Reviewed and stable  Last Vitals:  Vitals Value Taken Time  BP 131/63 10/27/21 1320  Temp    Pulse 95 10/27/21 1321  Resp 14 10/27/21 1321  SpO2 98 % 10/27/21 1321  Vitals shown include unvalidated device data.  Last Pain:  Vitals:   10/27/21 1035  TempSrc:   PainSc: 5       Patients Stated Pain Goal: 0 (56/31/49 7026)  Complications: No notable events documented.

## 2021-10-28 ENCOUNTER — Encounter (HOSPITAL_COMMUNITY): Payer: Self-pay | Admitting: Surgery

## 2021-10-28 NOTE — Anesthesia Postprocedure Evaluation (Signed)
Anesthesia Post Note  Patient: Frances Cunningham  Procedure(s) Performed: RE-EXCISION OFSUPERIOR MARGIN  LEFT LUMPECTOMY SITE (Left: Breast)     Patient location during evaluation: PACU Anesthesia Type: General Level of consciousness: awake and sedated Pain management: pain level controlled Vital Signs Assessment: post-procedure vital signs reviewed and stable Respiratory status: spontaneous breathing Cardiovascular status: stable Postop Assessment: no apparent nausea or vomiting Anesthetic complications: no   No notable events documented.  Last Vitals:  Vitals:   10/27/21 1335 10/27/21 1350  BP: 127/72 (!) 148/89  Pulse: 79 75  Resp: 20 (!) 22  Temp: 36.5 C 36.5 C  SpO2: 100% 100%    Last Pain:  Vitals:   10/27/21 1350  TempSrc:   PainSc: [name]

## 2021-10-31 LAB — SURGICAL PATHOLOGY

## 2021-11-01 ENCOUNTER — Encounter: Payer: Self-pay | Admitting: *Deleted

## 2021-11-15 NOTE — Progress Notes (Signed)
Location of Breast Cancer:  Ductal carcinoma in situ (DCIS) of left breast   Histology per Pathology Report:  09/29/2021 FINAL MICROSCOPIC DIAGNOSIS:  A. BREAST, LEFT W/SEED, LUMPECTOMY:  -  Ductal carcinoma in situ, intermediate grade  -  Ductal carcinoma in situ involves the superior margin  -  Previous biopsy site changes   Receptor Status: ER(100%), PR (40%)  Did patient present with symptoms (if so, please note symptoms) or was this found on screening mammography?: Screening mammogram: indeterminate linear calcifications central to the nipple, 1 cm mass sub-areolar depth, and 0.7 cm mass at 1:00 in the left breast. Diagnostic mammogram and [place]: two 1 cm masses in the upper outer left breast and linear calcifications behind the nipple of the left breast.  Past/Anticipated interventions by surgeon, if any:  10/27/2021 --Dr. Donnie Mesa Reexcision of superior margin left breast lumpectomy site  09/29/2021 --Dr. Donnie Mesa Left radioactive seed localized lumpectomy and central breast excison  Past/Anticipated interventions by medical oncology, if any:  Under care of Dr. Nicholas Lose 10/07/2021 --Treatment plan: Will discuss with Dr. [name] regarding the positive superior margin. Adjuvant radiation followed by adjuvant antiestrogen therapy with tamoxifen x5 years --Return to clinic after radiation to start antiestrogen therapy  Lymphedema issues, if any:  no    Pain issues, if any:  5/10 low back pain   SAFETY ISSUES: Prior radiation? Radioactive iodine for Graves disease Pacemaker/ICD? no Possible current pregnancy? No--postmenopausal  Is the patient on methotrexate? no  Current Complaints / other details:  none    Vitals:   11/17/21 1320  BP: (!) 143/79  Pulse: 89  Resp: 20  Temp: 97.8 F (36.6 C)  SpO2: 98%  Weight: 272 lb 6.4 oz (123.6 kg)  Height: 5\' 8"  (1.727 m)

## 2021-11-16 NOTE — Progress Notes (Signed)
Radiation Oncology         [phone] ________________________________  Name: Frances Cunningham MRN: [id number]  Date: 11/17/2021  DOB: Nov 20, 1961  Follow-Up Visit Note  Outpatient  CC: [facility]  Nicholas Lose, MD  Diagnosis:      ICD-10-CM   1. Ductal carcinoma in situ (DCIS) of left breast  D05.12        Stage 0 (cTis (DCIS), cN0, cM0) Left Breast Intermediate grade ductal carcinoma in-situ, ER+ / PR+ / Her2 not assessed  CHIEF COMPLAINT: Here to discuss management of left breast DCIS  Narrative:  The patient returns today for follow-up.     Since breast clinic consultation date of 09/22/21, she underwent genetic testing on 09/22/21 which revealed one pathogenic variant identified on the +RNAinsight panel in the MUTYH gene. 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 Additionally, a variant of uncertain significance was identified in the APC gene.   The patient opted to proceed with left breast lumpectomy on 09/29/21 under the care of Dr. [name]. Pathology from the procedure revealed: tumor size of at least 1.5 cm (2 foci); histology of intermediate grade ductal carcinoma in-situ; margin status to in situ disease of DCIS involving the superior margin; no lymph nodes were examined.  ER status: 100% positive with strong staining intensity; PR status 40% positive with moderate staining intensity, Her2 not assessed.  Accordingly, the patient underwent re-excision of the positive left superior margin on 10/27/21. Pathology from the procedure revealed no evidence of malignancy and fatty necrosis.   The patient met with Dr. Lindi Adie on 10/07/21 to discuss further treatment options. Following future XRT, the patient agreed to proceed with Dr. Geralyn Flash recommendation of adjuvant antiestrogen therapy with tamoxifen x 5 years. She will return to Dr. Lindi Adie following RT to start antiestrogens.   Symptomatically, the patient reports: Lymphedema  issues, if any:  no    Pain issues, if any:  5/10 low back pain   SAFETY ISSUES: Prior radiation? Radioactive iodine for Graves disease Pacemaker/ICD? no Possible current pregnancy? No--postmenopausal  Is the patient on methotrexate? no  Current Complaints / other details:  none        ALLERGIES:  has No Known Allergies.  Meds: Current Outpatient Medications  Medication Sig Dispense Refill   acetaminophen (TYLENOL) 500 MG tablet Take 1,500 mg by mouth every 6 (six) hours as needed (pain.).     albuterol (VENTOLIN HFA) 108 (90 Base) MCG/ACT inhaler Inhale 2 puffs into the lungs every 6 (six) hours as needed for wheezing or shortness of breath.     amLODipine (NORVASC) 5 MG tablet Take 5 mg by mouth in the morning.     Cholecalciferol (VITAMIN D3) 50 MCG (2000 UT) TABS Take 2,000 Units by mouth in the morning.     gabapentin (NEURONTIN) 100 MG capsule Take 100 mg by mouth 2 (two) times daily as needed for pain.     HYDROcodone-acetaminophen (NORCO/VICODIN) 5-325 MG tablet Take 1 tablet by mouth every 6 (six) hours as needed for moderate pain. 15 tablet 0   levothyroxine (SYNTHROID) 137 MCG tablet Take 137 mcg by mouth daily before breakfast.     meloxicam (MOBIC) 15 MG tablet Take 1 tablet (15 mg total) by mouth daily. Use as needed for back pain (Patient taking differently: Take 15 mg by mouth daily as needed for pain.) 90 tablet 0   traMADol (ULTRAM) 50 MG tablet Take 50 mg by mouth daily as needed for moderate pain.  TRULICITY 5.69 VX/4.8AX SOPN Inject 0.75 mg into the skin every Wednesday.     No current facility-administered medications for this encounter.    Physical Findings:  height is '5\' 8"'  (1.727 m) and weight is 272 lb 6.4 oz (123.6 kg). Her temperature is 97.8 F (36.6 C). Her blood pressure is 143/79 (abnormal) and her pulse is 89. Her respiration is 20 and oxygen saturation is 98%. .     General: Alert and oriented, in no acute distress MSK: Good ROM in shoulder,  left Psychiatric: Judgment and insight are intact. Affect is appropriate. Breast exam reveals central lumpectomy scar healed well over left breast, nipple was removed.  Lab Findings: Lab Results  Component Value Date   WBC 7.1 10/27/2021   HGB 14.5 10/27/2021   HCT 42.5 10/27/2021   MCV 89.9 10/27/2021   PLT 284 10/27/2021     Radiographic Findings: No results found.  Impression/Plan: This is a wonderful 60 yo woman with LEFT BREAST DCIS  We discussed adjuvant radiotherapy today.  I recommend radiation therapy to the left breast over 4 weeks in order to reduce risk of in-breast relapse.  I reviewed the logistics, benefits, risks, and potential side effects of this treatment in detail. Risks may include but not necessary be limited to acute and late injury tissue in the radiation fields such as skin irritation (change in color/pigmentation, itching, dryness, pain, peeling). She may experience fatigue. We also discussed possible risk of long term cosmetic changes or scar tissue. There is also a smaller risk for lung toxicity, cardiac toxicity,  lymphedema, musculoskeletal changes, rib fragility or induction of a second malignancy, late chronic non-healing soft tissue wound.    The patient asked good questions which I answered to her satisfaction. She is enthusiastic about proceeding with treatment. A consent form has been signed and placed in her chart. CT simulation will be today and treatment will start in 2 wks.   On date of service, in total, I spent 25 minutes on this encounter. Patient was seen in person.  _____________________________________   Eppie Gibson, MD  This document serves as a record of services personally performed by Eppie Gibson, MD. It was created on her behalf by Roney Mans, a trained medical scribe. The creation of this record is based on the scribe's personal observations and the provider's statements to them. This document has been checked and approved by the  attending provider.

## 2021-11-17 ENCOUNTER — Ambulatory Visit
Admission: RE | Admit: 2021-11-17 | Discharge: 2021-11-17 | Disposition: A | Discharge status: Home / Self Care | Payer: Managed Care, Other (non HMO) | Source: Ambulatory Visit | Attending: Radiation Oncology | Admitting: Radiation Oncology

## 2021-11-17 ENCOUNTER — Encounter: Payer: Self-pay | Admitting: Radiation Oncology

## 2021-11-17 ENCOUNTER — Ambulatory Visit: Payer: Managed Care, Other (non HMO) | Admitting: Radiation Oncology

## 2021-11-17 ENCOUNTER — Other Ambulatory Visit: Payer: Self-pay

## 2021-11-17 VITALS — BP 143/79 | HR 89 | Temp 97.8°F | Resp 20 | Ht 68.0 in | Wt 272.4 lb

## 2021-11-17 DIAGNOSIS — Z1501 Genetic susceptibility to malignant neoplasm of breast: Secondary | ICD-10-CM | POA: Insufficient documentation

## 2021-11-17 DIAGNOSIS — Z79899 Other long term (current) drug therapy: Secondary | ICD-10-CM | POA: Diagnosis not present

## 2021-11-17 DIAGNOSIS — D0512 Intraductal carcinoma in situ of left breast: Secondary | ICD-10-CM | POA: Insufficient documentation

## 2021-11-17 DIAGNOSIS — Z170 Estrogen receptor positive status [ER+]: Secondary | ICD-10-CM | POA: Insufficient documentation

## 2021-11-17 DIAGNOSIS — M545 Low back pain, unspecified: Secondary | ICD-10-CM | POA: Insufficient documentation

## 2021-11-17 NOTE — Progress Notes (Signed)
See MD note for nursing evaluation. °

## 2021-11-19 DIAGNOSIS — D0512 Intraductal carcinoma in situ of left breast: Secondary | ICD-10-CM | POA: Diagnosis present

## 2021-11-24 ENCOUNTER — Encounter: Payer: Self-pay | Admitting: *Deleted

## 2021-12-01 ENCOUNTER — Other Ambulatory Visit: Payer: Self-pay

## 2021-12-01 ENCOUNTER — Ambulatory Visit
Admission: RE | Admit: 2021-12-01 | Discharge: 2021-12-01 | Disposition: A | Discharge status: Home / Self Care | Payer: Managed Care, Other (non HMO) | Source: Ambulatory Visit | Attending: Radiation Oncology | Admitting: Radiation Oncology

## 2021-12-01 DIAGNOSIS — Z510 Encounter for antineoplastic radiation therapy: Secondary | ICD-10-CM | POA: Diagnosis not present

## 2021-12-01 DIAGNOSIS — D0512 Intraductal carcinoma in situ of left breast: Secondary | ICD-10-CM | POA: Diagnosis present

## 2021-12-02 ENCOUNTER — Ambulatory Visit
Admission: RE | Admit: 2021-12-02 | Discharge: 2021-12-02 | Disposition: A | Discharge status: Home / Self Care | Payer: Managed Care, Other (non HMO) | Source: Ambulatory Visit | Attending: Radiation Oncology | Admitting: Radiation Oncology

## 2021-12-02 DIAGNOSIS — Z510 Encounter for antineoplastic radiation therapy: Secondary | ICD-10-CM | POA: Diagnosis not present

## 2021-12-03 ENCOUNTER — Ambulatory Visit
Admission: RE | Admit: 2021-12-03 | Discharge: 2021-12-03 | Disposition: A | Discharge status: Home / Self Care | Payer: Managed Care, Other (non HMO) | Source: Ambulatory Visit | Attending: Radiation Oncology | Admitting: Radiation Oncology

## 2021-12-03 ENCOUNTER — Other Ambulatory Visit: Payer: Self-pay

## 2021-12-03 DIAGNOSIS — Z510 Encounter for antineoplastic radiation therapy: Secondary | ICD-10-CM | POA: Diagnosis not present

## 2021-12-06 ENCOUNTER — Other Ambulatory Visit: Payer: Self-pay

## 2021-12-06 ENCOUNTER — Ambulatory Visit
Admission: RE | Admit: 2021-12-06 | Discharge: 2021-12-06 | Disposition: A | Discharge status: Home / Self Care | Payer: Managed Care, Other (non HMO) | Source: Ambulatory Visit | Attending: Radiation Oncology | Admitting: Radiation Oncology

## 2021-12-06 DIAGNOSIS — D0512 Intraductal carcinoma in situ of left breast: Secondary | ICD-10-CM

## 2021-12-06 DIAGNOSIS — Z510 Encounter for antineoplastic radiation therapy: Secondary | ICD-10-CM | POA: Diagnosis not present

## 2021-12-06 MED ORDER — RADIAPLEXRX EX GEL: Freq: Once | CUTANEOUS | Status: AC

## 2021-12-06 MED ORDER — ALRA NON-METALLIC DEODORANT (RAD-ONC)
1.0000 "application " | Freq: Once | TOPICAL | Status: AC
  Administered 2021-12-06: 1 via TOPICAL

## 2021-12-06 NOTE — Progress Notes (Signed)
Pt here for patient teaching.  Pt given Managing Acute Radiation Side Effects for Head and Neck Cancer handout, skin care instructions, Alra deodorant, and Radiaplex gel.  Reviewed areas of pertinence such as fatigue, hair loss, nausea and vomiting, skin changes, headache, breast tenderness, and breast swelling . Pt able to give teach back of to pat skin, use unscented/gentle soap, and drink plenty of water,apply Radiaplex bid, avoid applying anything to skin within 4 hours of treatment, avoid wearing an under wire bra, and to use an electric razor if they must shave. Pt verbalizes understanding of information given and will contact nursing with any questions or concerns.     [email or web address]

## 2021-12-07 ENCOUNTER — Ambulatory Visit
Admission: RE | Admit: 2021-12-07 | Discharge: 2021-12-07 | Disposition: A | Discharge status: Home / Self Care | Payer: Managed Care, Other (non HMO) | Source: Ambulatory Visit | Attending: Radiation Oncology | Admitting: Radiation Oncology

## 2021-12-07 DIAGNOSIS — Z510 Encounter for antineoplastic radiation therapy: Secondary | ICD-10-CM | POA: Diagnosis not present

## 2021-12-08 ENCOUNTER — Ambulatory Visit
Admission: RE | Admit: 2021-12-08 | Discharge: 2021-12-08 | Disposition: A | Discharge status: Home / Self Care | Payer: Managed Care, Other (non HMO) | Source: Ambulatory Visit | Attending: Radiation Oncology | Admitting: Radiation Oncology

## 2021-12-08 ENCOUNTER — Other Ambulatory Visit: Payer: Self-pay

## 2021-12-08 DIAGNOSIS — Z510 Encounter for antineoplastic radiation therapy: Secondary | ICD-10-CM | POA: Diagnosis not present

## 2021-12-09 ENCOUNTER — Ambulatory Visit
Admission: RE | Admit: 2021-12-09 | Discharge: 2021-12-09 | Disposition: A | Discharge status: Home / Self Care | Payer: Managed Care, Other (non HMO) | Source: Ambulatory Visit | Attending: Radiation Oncology | Admitting: Radiation Oncology

## 2021-12-09 DIAGNOSIS — Z510 Encounter for antineoplastic radiation therapy: Secondary | ICD-10-CM | POA: Diagnosis not present

## 2021-12-10 ENCOUNTER — Ambulatory Visit
Admission: RE | Admit: 2021-12-10 | Discharge: 2021-12-10 | Disposition: A | Discharge status: Home / Self Care | Payer: Managed Care, Other (non HMO) | Source: Ambulatory Visit | Attending: Radiation Oncology | Admitting: Radiation Oncology

## 2021-12-10 ENCOUNTER — Other Ambulatory Visit: Payer: Self-pay

## 2021-12-10 DIAGNOSIS — Z510 Encounter for antineoplastic radiation therapy: Secondary | ICD-10-CM | POA: Diagnosis not present

## 2021-12-13 ENCOUNTER — Other Ambulatory Visit: Payer: Self-pay

## 2021-12-13 ENCOUNTER — Ambulatory Visit
Admission: RE | Admit: 2021-12-13 | Discharge: 2021-12-13 | Disposition: A | Discharge status: Home / Self Care | Payer: Managed Care, Other (non HMO) | Source: Ambulatory Visit | Attending: Radiation Oncology | Admitting: Radiation Oncology

## 2021-12-13 DIAGNOSIS — Z510 Encounter for antineoplastic radiation therapy: Secondary | ICD-10-CM | POA: Diagnosis not present

## 2021-12-14 ENCOUNTER — Other Ambulatory Visit: Payer: Self-pay | Admitting: Radiation Oncology

## 2021-12-14 ENCOUNTER — Ambulatory Visit
Admission: RE | Admit: 2021-12-14 | Discharge: 2021-12-14 | Disposition: A | Discharge status: Home / Self Care | Payer: Managed Care, Other (non HMO) | Source: Ambulatory Visit | Attending: Radiation Oncology | Admitting: Radiation Oncology

## 2021-12-14 DIAGNOSIS — D0512 Intraductal carcinoma in situ of left breast: Secondary | ICD-10-CM

## 2021-12-14 DIAGNOSIS — R110 Nausea: Secondary | ICD-10-CM

## 2021-12-14 DIAGNOSIS — Z510 Encounter for antineoplastic radiation therapy: Secondary | ICD-10-CM | POA: Diagnosis not present

## 2021-12-14 MED ORDER — ONDANSETRON HCL 8 MG PO TABS: 8.0000 mg | ORAL_TABLET | Freq: Three times a day (TID) | ORAL | 1 refills | Status: DC | PRN

## 2021-12-14 MED ORDER — PROMETHAZINE HCL 25 MG PO TABS: 25.0000 mg | ORAL_TABLET | Freq: Four times a day (QID) | ORAL | 1 refills | Status: DC | PRN

## 2021-12-15 ENCOUNTER — Ambulatory Visit
Admission: RE | Admit: 2021-12-15 | Discharge: 2021-12-15 | Disposition: A | Discharge status: Home / Self Care | Payer: Managed Care, Other (non HMO) | Source: Ambulatory Visit | Attending: Radiation Oncology | Admitting: Radiation Oncology

## 2021-12-15 ENCOUNTER — Other Ambulatory Visit: Payer: Self-pay

## 2021-12-15 DIAGNOSIS — Z510 Encounter for antineoplastic radiation therapy: Secondary | ICD-10-CM | POA: Diagnosis not present

## 2021-12-16 ENCOUNTER — Ambulatory Visit
Admission: RE | Admit: 2021-12-16 | Discharge: 2021-12-16 | Disposition: A | Discharge status: Home / Self Care | Payer: Managed Care, Other (non HMO) | Source: Ambulatory Visit | Attending: Radiation Oncology | Admitting: Radiation Oncology

## 2021-12-16 DIAGNOSIS — Z510 Encounter for antineoplastic radiation therapy: Secondary | ICD-10-CM | POA: Diagnosis not present

## 2021-12-17 ENCOUNTER — Ambulatory Visit
Admission: RE | Admit: 2021-12-17 | Discharge: 2021-12-17 | Disposition: A | Discharge status: Home / Self Care | Payer: Managed Care, Other (non HMO) | Source: Ambulatory Visit | Attending: Radiation Oncology | Admitting: Radiation Oncology

## 2021-12-17 ENCOUNTER — Other Ambulatory Visit: Payer: Self-pay

## 2021-12-17 DIAGNOSIS — Z510 Encounter for antineoplastic radiation therapy: Secondary | ICD-10-CM | POA: Diagnosis not present

## 2021-12-20 ENCOUNTER — Ambulatory Visit
Admission: RE | Admit: 2021-12-20 | Discharge: 2021-12-20 | Disposition: A | Discharge status: Home / Self Care | Payer: Managed Care, Other (non HMO) | Source: Ambulatory Visit | Attending: Radiation Oncology | Admitting: Radiation Oncology

## 2021-12-20 ENCOUNTER — Ambulatory Visit: Payer: Managed Care, Other (non HMO) | Admitting: Radiation Oncology

## 2021-12-20 ENCOUNTER — Other Ambulatory Visit: Payer: Self-pay

## 2021-12-20 DIAGNOSIS — Z510 Encounter for antineoplastic radiation therapy: Secondary | ICD-10-CM | POA: Diagnosis not present

## 2021-12-21 ENCOUNTER — Ambulatory Visit
Admission: RE | Admit: 2021-12-21 | Discharge: 2021-12-21 | Disposition: A | Discharge status: Home / Self Care | Payer: Managed Care, Other (non HMO) | Source: Ambulatory Visit | Attending: Radiation Oncology | Admitting: Radiation Oncology

## 2021-12-21 DIAGNOSIS — Z510 Encounter for antineoplastic radiation therapy: Secondary | ICD-10-CM | POA: Diagnosis not present

## 2021-12-21 NOTE — Assessment & Plan Note (Signed)
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  Adjuvant radiation 12/02/21- 12/28/21 2. followed by adjuvant antiestrogen therapy with tamoxifen x5 years  Return to clinic in 3 months for SCP visit

## 2021-12-21 NOTE — Progress Notes (Signed)
Patient Care Team: [facility] - Philomena Doheny, Paulette Blanch, RN as Oncology Nurse Navigator Rockwell Germany, RN as Oncology Nurse Navigator Donnie Mesa, MD as Consulting Physician (General Surgery) Nicholas Lose, MD as Consulting Physician (Hematology and Oncology) Eppie Gibson, MD as Attending Physician (Radiation Oncology)  DIAGNOSIS:    ICD-10-CM   1. Ductal carcinoma in situ (DCIS) of left breast  D05.12       SUMMARY OF ONCOLOGIC HISTORY: Oncology History  Ductal carcinoma in situ (DCIS) of left breast  09/17/2021 Initial Diagnosis   Screening mammogram: indeterminate linear calcifications central to the nipple, 1 cm mass sub-areolar depth, and 0.7 cm mass at 1:00 in the left breast. Diagnostic mammogram and [place]: two 1 cm masses in the upper outer left breast and linear calcifications behind the nipple of the left breast.  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 with above-mentioned history of with above-mentioned history of left breast DCIS. She presents to the clinic today for follow-up.  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  ALLERGIES:  has No Known Allergies.  MEDICATIONS:  Current Outpatient Medications  Medication Sig Dispense Refill   acetaminophen (TYLENOL) 500 MG tablet Take 1,500 mg by mouth every 6 (six) hours as needed (pain.).     albuterol (VENTOLIN HFA) 108 (90 Base) MCG/ACT inhaler Inhale 2 puffs into the lungs every 6 (six) hours as needed for  wheezing or shortness of breath.     amLODipine (NORVASC) 5 MG tablet Take 5 mg by mouth in the morning.     Cholecalciferol (VITAMIN D3) 50 MCG (2000 UT) TABS Take 2,000 Units by mouth in the morning.     gabapentin (NEURONTIN) 100 MG capsule Take 100 mg by mouth 2 (two) times daily as needed for pain.     HYDROcodone-acetaminophen (NORCO/VICODIN) 5-325 MG tablet Take 1 tablet by mouth every 6 (six) hours as needed for moderate pain. 15 tablet 0   levothyroxine (SYNTHROID) 137 MCG tablet Take 137 mcg by mouth daily before breakfast.     meloxicam (MOBIC) 15 MG tablet Take 1 tablet (15 mg total) by mouth daily. Use as needed for back pain (Patient taking differently: Take 15 mg by mouth daily as needed for pain.) 90 tablet 0   ondansetron (ZOFRAN) 8 MG tablet Take 1 tablet (8 mg total) by mouth every 8 (eight) hours as needed for nausea or vomiting. 15 tablet 1   promethazine (PHENERGAN) 25 MG tablet Take 1 tablet (25 mg total) by mouth every 6 (six) hours as needed for nausea or vomiting. 20 tablet 1   traMADol (ULTRAM) 50 MG tablet Take 50 mg by mouth daily as needed for moderate pain.     TRULICITY 5.92 TW/4.4QK SOPN Inject 0.75 mg into the skin every Wednesday.     No current facility-administered medications for this visit.    PHYSICAL EXAMINATION: ECOG PERFORMANCE STATUS: 1 - Symptomatic but completely ambulatory  Vitals:   12/22/21 1006  BP: (!) 152/85  Pulse: 86  Resp: 18  Temp: (!) 97.2 F (36.2 C)  SpO2: 99%   Filed Weights   12/22/21 1006  Weight: 271 lb 12.8 oz (123.3 kg)      LABORATORY DATA:  I have reviewed the data as listed CMP Latest Ref Rng & Units 10/27/2021 09/22/2021 04/09/2013  Glucose 70 - 99 mg/dL 144(H) 150(H) 99  BUN 8 - 23 mg/dL '13 17 12  ' Creatinine 0.44 - 1.00 mg/dL 1.17(H) 1.02(H) 1.08  Sodium 135 - 145 mmol/L 136 141 142  Potassium 3.5 - 5.1 mmol/L 4.4 3.8 4.0  Chloride 98 - 111 mmol/L 106 110 108  CO2 22 - 32 mmol/L 21(L) 21(L) 24  Calcium 8.9  - 10.3 mg/dL 8.7(L) 8.8(L) 8.7  Total Protein 6.5 - 8.1 g/dL - 7.9 7.4  Total Bilirubin 0.3 - 1.2 mg/dL - 0.7 0.6  Alkaline Phos 38 - 126 U/L - 99 84  AST 15 - 41 U/L - 17 16  ALT 0 - 44 U/L - 21 15    Lab Results  Component Value Date   WBC 7.1 10/27/2021   HGB 14.5 10/27/2021   HCT 42.5 10/27/2021   MCV 89.9 10/27/2021   PLT 284 10/27/2021   NEUTROABS 5.0 09/22/2021    ASSESSMENT & PLAN:  Ductal carcinoma in situ (DCIS) of left breast 09/29/2021:Left lumpectomy: 1.5 cm 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  Adjuvant radiation 12/02/21- 12/28/21 2. followed by adjuvant antiestrogen therapy with tamoxifen 10 mg x5 years   Tamoxifen counseling: We discussed the risks and benefits of tamoxifen. These include but not limited to insomnia, hot flashes, mood changes, vaginal dryness, and weight gain. Although rare, serious side effects including endometrial cancer, risk of blood clots were also discussed. We strongly believe that the benefits far outweigh the risks. Patient understands these risks and consented to starting treatment. Planned treatment duration is 5 years.  Return to clinic in 3 months for SCP visit    No orders of the defined types were placed in this encounter.  The patient has a good understanding of the overall plan. she agrees with it. she will call with any problems that may develop before the next visit here.  Total time spent: 30 mins including face to face time and time spent for planning, charting and coordination of care  Rulon Eisenmenger, MD, MPH 12/22/2021  I, Thana Ates, am acting as scribe for Dr. Nicholas Lose.  I have reviewed the above documentation for accuracy and completeness, and I agree with the above.

## 2021-12-22 ENCOUNTER — Inpatient Hospital Stay: Payer: Managed Care, Other (non HMO) | Attending: Hematology and Oncology | Admitting: Hematology and Oncology

## 2021-12-22 ENCOUNTER — Other Ambulatory Visit: Payer: Self-pay

## 2021-12-22 ENCOUNTER — Ambulatory Visit
Admission: RE | Admit: 2021-12-22 | Discharge: 2021-12-22 | Disposition: A | Discharge status: Home / Self Care | Payer: Managed Care, Other (non HMO) | Source: Ambulatory Visit | Attending: Radiation Oncology | Admitting: Radiation Oncology

## 2021-12-22 DIAGNOSIS — D0512 Intraductal carcinoma in situ of left breast: Secondary | ICD-10-CM | POA: Diagnosis not present

## 2021-12-22 DIAGNOSIS — Z510 Encounter for antineoplastic radiation therapy: Secondary | ICD-10-CM | POA: Diagnosis not present

## 2021-12-22 MED ORDER — TAMOXIFEN CITRATE 10 MG PO TABS: 10.0000 mg | ORAL_TABLET | Freq: Every day | ORAL | 3 refills | Status: DC

## 2021-12-23 ENCOUNTER — Ambulatory Visit: Payer: Managed Care, Other (non HMO)

## 2021-12-24 ENCOUNTER — Ambulatory Visit
Admission: RE | Admit: 2021-12-24 | Discharge: 2021-12-24 | Disposition: A | Discharge status: Home / Self Care | Payer: Managed Care, Other (non HMO) | Source: Ambulatory Visit | Attending: Radiation Oncology | Admitting: Radiation Oncology

## 2021-12-24 DIAGNOSIS — Z510 Encounter for antineoplastic radiation therapy: Secondary | ICD-10-CM | POA: Diagnosis not present

## 2021-12-27 ENCOUNTER — Ambulatory Visit: Payer: Managed Care, Other (non HMO)

## 2021-12-27 ENCOUNTER — Other Ambulatory Visit: Payer: Self-pay

## 2021-12-27 ENCOUNTER — Encounter: Payer: Self-pay | Admitting: *Deleted

## 2021-12-27 ENCOUNTER — Ambulatory Visit
Admission: RE | Admit: 2021-12-27 | Discharge: 2021-12-27 | Disposition: A | Discharge status: Home / Self Care | Payer: Managed Care, Other (non HMO) | Source: Ambulatory Visit | Attending: Radiation Oncology | Admitting: Radiation Oncology

## 2021-12-27 DIAGNOSIS — Z510 Encounter for antineoplastic radiation therapy: Secondary | ICD-10-CM | POA: Diagnosis not present

## 2021-12-27 DIAGNOSIS — D0512 Intraductal carcinoma in situ of left breast: Secondary | ICD-10-CM

## 2021-12-28 ENCOUNTER — Ambulatory Visit: Payer: Managed Care, Other (non HMO)

## 2021-12-28 ENCOUNTER — Ambulatory Visit
Admission: RE | Admit: 2021-12-28 | Discharge: 2021-12-28 | Disposition: A | Discharge status: Home / Self Care | Payer: Managed Care, Other (non HMO) | Source: Ambulatory Visit | Attending: Radiation Oncology | Admitting: Radiation Oncology

## 2021-12-28 DIAGNOSIS — Z510 Encounter for antineoplastic radiation therapy: Secondary | ICD-10-CM | POA: Diagnosis not present

## 2021-12-29 ENCOUNTER — Ambulatory Visit
Admission: RE | Admit: 2021-12-29 | Discharge: 2021-12-29 | Disposition: A | Discharge status: Home / Self Care | Payer: Managed Care, Other (non HMO) | Source: Ambulatory Visit | Attending: Radiation Oncology | Admitting: Radiation Oncology

## 2021-12-29 ENCOUNTER — Encounter: Payer: Self-pay | Admitting: Radiation Oncology

## 2021-12-29 ENCOUNTER — Other Ambulatory Visit: Payer: Self-pay

## 2021-12-29 DIAGNOSIS — D0512 Intraductal carcinoma in situ of left breast: Secondary | ICD-10-CM | POA: Insufficient documentation

## 2022-01-12 NOTE — Progress Notes (Signed)
° °                                                                                                                                                          °  Patient Name: Frances Cunningham MRN: [id number] DOB: [birth year] Referring Physician: [place]a [place] Date of Service: 12/29/2021 [facility], [place]                                                        End Of Treatment Note  Diagnoses: D05.12-Intraductal carcinoma in situ of left breast  Cancer Staging:  Cancer Staging  Ductal carcinoma in situ (DCIS) of left breast Staging form: Breast, AJCC 8th Edition - Clinical: Stage 0 (cTis (DCIS), cN0, cM0, ER+, PR+, HER2: Not Assessed) - Signed by Nicholas Lose, MD on 09/22/2021 Stage prefix: Initial diagnosis   Intent: Curative  Radiation Treatment Dates: 12/01/2021 through 12/29/2021 Site Technique Total Dose (Gy) Dose per Fx (Gy) Completed Fx Beam Energies  Breast, Left: Breast_L 3D 40.05/40.05 2.67 15/15 10X, 15X  Breast, Left: Breast_L_Bst 3D 10/10 2 5/5 6X, 10X   Narrative: The patient tolerated radiation therapy relatively well.  Plan: The patient will follow-up with radiation oncology in 32mo -----------------------------------  SEppie Gibson MD

## 2022-01-20 ENCOUNTER — Encounter (HOSPITAL_COMMUNITY): Payer: Self-pay

## 2022-01-26 ENCOUNTER — Encounter: Payer: Self-pay | Admitting: Radiation Oncology

## 2022-01-26 ENCOUNTER — Ambulatory Visit
Admission: RE | Admit: 2022-01-26 | Discharge: 2022-01-26 | Disposition: A | Discharge status: Home / Self Care | Payer: Managed Care, Other (non HMO) | Source: Ambulatory Visit | Attending: Radiation Oncology | Admitting: Radiation Oncology

## 2022-01-26 ENCOUNTER — Other Ambulatory Visit: Payer: Self-pay

## 2022-01-26 VITALS — BP 147/70 | HR 86 | Temp 96.6°F | Resp 18 | Ht 68.0 in | Wt 268.0 lb

## 2022-01-26 DIAGNOSIS — D0512 Intraductal carcinoma in situ of left breast: Secondary | ICD-10-CM | POA: Diagnosis present

## 2022-01-26 DIAGNOSIS — Z170 Estrogen receptor positive status [ER+]: Secondary | ICD-10-CM | POA: Diagnosis not present

## 2022-01-26 DIAGNOSIS — Z7985 Long-term (current) use of injectable non-insulin antidiabetic drugs: Secondary | ICD-10-CM | POA: Diagnosis not present

## 2022-01-26 DIAGNOSIS — Z791 Long term (current) use of non-steroidal anti-inflammatories (NSAID): Secondary | ICD-10-CM | POA: Diagnosis not present

## 2022-01-26 DIAGNOSIS — Z79899 Other long term (current) drug therapy: Secondary | ICD-10-CM | POA: Insufficient documentation

## 2022-01-26 NOTE — Progress Notes (Signed)
Frances Cunningham presents today for follow-up after completing radiation to her left breast on 12/29/2021 ? ?Pain: Reports lingering tenderness and shooting pains to left breast (mainly along incision) ?Skin: Reports continued hyperpigmentation to treatment field. Applying vitamin E gel/ointment  ?ROM: Patient denies ?Lymphedema: Patient denies ?MedOnc F/U: Scheduled for [facility] on 03/22/2022 with Mendel Ryder Causey-NP ?Other issues of note: Continues to deal with fatigue (reports that it comes in waves). Otherwise, reports she's doing well and denies any new or persistent concerns.  ? ?Pt reports Yes No Comments  ?Tamoxifen [x]  []  Reports hit flashes, muscle cramps, and occasional nausea  ?Letrozole []  [x]    ?Anastrazole []  [x]    ?Mammogram [x]  Date: TBD []    ? ? ?

## 2022-01-26 NOTE — Progress Notes (Signed)
?Radiation Oncology         (336) [phone] ?________________________________ ? ?Name: Frances Cunningham MRN: [id number]  ?Date: 01/26/2022  DOB: 14-Oct-1961 ? ?Follow-Up Visit Note ? ?Outpatient ? ?CC: [facility] [place] ? ?Diagnosis and Prior Radiotherapy:  ?  ICD-10-CM   ?1. Ductal carcinoma in situ (DCIS) of left breast  D05.12   ?  ? ? ?Intent: Curative ? ?Radiation Treatment Dates: 12/01/2021 through 12/29/2021 ?Site Technique Total Dose (Gy) Dose per Fx (Gy) Completed Fx Beam Energies  ?Breast, Left: Breast_L 3D 40.05/40.05 2.67 15/15 10X, 15X  ?Breast, Left: Breast_L_Bst 3D 10/10 2 5/5 6X, 10X  ? ?CHIEF COMPLAINT: Here for follow-up and surveillance of DCIS ? ?Narrative:  The patient returns today for routine follow-up.  Frances Cunningham presents today for follow-up after completing radiation to her left breast on 12/29/2021 ? ?Pain: Reports lingering tenderness and shooting pains to left breast (mainly along incision) ?Skin: Reports continued hyperpigmentation to treatment field. Applying vitamin E gel/ointment  ?ROM: Patient denies ?Lymphedema: Patient denies ?MedOnc F/U: Scheduled for [facility] on 03/22/2022 with Mendel Ryder Causey-NP ?Other issues of note: Continues to deal with fatigue (reports that it comes in waves). Otherwise, reports she's doing well and denies any new or persistent concerns.  ? ?Pt reports Yes No Comments  ?Tamoxifen [x]  []  Reports hot flashes, muscle cramps, and occasional nausea  ?Letrozole []  [x]    ?Anastrazole []  [x]    ?Mammogram [x]  Date: TBD []    ?                             ? ?ALLERGIES:  has No Known Allergies. ? ?Meds: ?Current Outpatient Medications  ?Medication Sig Dispense Refill  ? acetaminophen (TYLENOL) 500 MG tablet Take 1,500 mg by mouth every 6 (six) hours as needed (pain.).    ? albuterol (VENTOLIN HFA) 108 (90 Base) MCG/ACT inhaler Inhale 2 puffs into the lungs every 6 (six) hours as needed for wheezing or shortness of breath.    ?  amLODipine (NORVASC) 5 MG tablet Take 5 mg by mouth in the morning.    ? Cholecalciferol (VITAMIN D3) 50 MCG (2000 UT) TABS Take 2,000 Units by mouth in the morning.    ? gabapentin (NEURONTIN) 100 MG capsule Take 100 mg by mouth 2 (two) times daily as needed for pain.    ? HYDROcodone-acetaminophen (NORCO/VICODIN) 5-325 MG tablet Take 1 tablet by mouth every 6 (six) hours as needed for moderate pain. 15 tablet 0  ? levothyroxine (SYNTHROID) 137 MCG tablet Take 137 mcg by mouth daily before breakfast.    ? meloxicam (MOBIC) 15 MG tablet Take 1 tablet (15 mg total) by mouth daily. Use as needed for back pain (Patient taking differently: Take 15 mg by mouth daily as needed for pain.) 90 tablet 0  ? ondansetron (ZOFRAN) 8 MG tablet Take 1 tablet (8 mg total) by mouth every 8 (eight) hours as needed for nausea or vomiting. 15 tablet 1  ? promethazine (PHENERGAN) 25 MG tablet Take 1 tablet (25 mg total) by mouth every 6 (six) hours as needed for nausea or vomiting. 20 tablet 1  ? tamoxifen (NOLVADEX) 10 MG tablet Take 1 tablet (10 mg total) by mouth daily. 90 tablet 3  ? TRULICITY 3.71 IR/6.7EL SOPN Inject 0.75 mg into the skin every Wednesday.    ? ?No current facility-administered medications for this encounter.  ? ? ?Physical Findings: ?The patient is  in no acute distress. Patient is alert and oriented. ? height is 5\' 8"  (1.727 m) and weight is 268 lb (121.6 kg). Her temporal temperature is 96.6 ?F (35.9 ?C) (abnormal). Her blood pressure is 147/70 (abnormal) and her pulse is 86. Her respiration is 18 and oxygen saturation is 97%. Marland Kitchen    ?Satisfactory skin healing in radiotherapy fields. Skin is intact over left breast with some residual hyperpigmentation  ? ? ?Lab Findings: ?Lab Results  ?Component Value Date  ? WBC 7.1 10/27/2021  ? HGB 14.5 10/27/2021  ? HCT 42.5 10/27/2021  ? MCV 89.9 10/27/2021  ? PLT 284 10/27/2021  ? ? ?Radiographic Findings: ?No results found. ? ?Impression/Plan: Healing well from radiotherapy  to the breast tissue. ? ?Continue skin care with topical Vitamin E Oil and / or gel for at least 2 more months for further healing. ? ?I encouraged her to continue with yearly mammography as appropriate (for intact breast tissue) and followup with medical oncology. I will see her back on an as-needed basis. I have encouraged her to call if she has any issues or concerns in the future. I wished her the very best. ? ?Re: possible side effects from anti estrogens: she states she often takes a while to acclimate to new meds. She is hopeful she will feel better by survivorship appt. ? ?On date of service, in total, I spent 20 minutes on this encounter. Patient was seen in person.  ?_____________________________________ ? ? ?Eppie Gibson, MD  ?

## 2022-03-18 ENCOUNTER — Telehealth: Payer: Self-pay | Admitting: *Deleted

## 2022-03-22 ENCOUNTER — Other Ambulatory Visit: Payer: Self-pay

## 2022-03-22 ENCOUNTER — Encounter: Payer: Self-pay | Admitting: Adult Health

## 2022-03-22 ENCOUNTER — Inpatient Hospital Stay: Payer: Managed Care, Other (non HMO) | Attending: Hematology and Oncology | Admitting: Adult Health

## 2022-03-22 VITALS — BP 139/85 | HR 90 | Temp 97.5°F | Resp 18 | Ht 68.0 in | Wt 266.1 lb

## 2022-03-22 DIAGNOSIS — M546 Pain in thoracic spine: Secondary | ICD-10-CM | POA: Diagnosis not present

## 2022-03-22 DIAGNOSIS — G8929 Other chronic pain: Secondary | ICD-10-CM

## 2022-03-22 DIAGNOSIS — Z923 Personal history of irradiation: Secondary | ICD-10-CM | POA: Diagnosis not present

## 2022-03-22 DIAGNOSIS — N951 Menopausal and female climacteric states: Secondary | ICD-10-CM | POA: Diagnosis not present

## 2022-03-22 DIAGNOSIS — M549 Dorsalgia, unspecified: Secondary | ICD-10-CM | POA: Insufficient documentation

## 2022-03-22 DIAGNOSIS — D0512 Intraductal carcinoma in situ of left breast: Secondary | ICD-10-CM | POA: Diagnosis not present

## 2022-03-22 NOTE — Progress Notes (Signed)
SURVIVORSHIP VISIT: ? ? ? ?BRIEF ONCOLOGIC HISTORY:  ?Oncology History  ?Ductal carcinoma in situ (DCIS) of left breast  ?09/17/2021 Initial Diagnosis  ? Screening mammogram: indeterminate linear calcifications central to the nipple, 1 cm mass sub-areolar depth, and 0.7 cm mass at 1:00 in the left breast. Diagnostic mammogram and [place]: two 1 cm masses in the upper outer left breast and linear calcifications behind the nipple of the left breast.  Biopsy: DCIS with calcifications ER 100%, PR 40% early invasion cannot be ruled out ?  ?09/22/2021 Cancer Staging  ? Staging form: Breast, AJCC 8th Edition ?- Clinical: Stage 0 (cTis (DCIS), cN0, cM0, ER+, PR+, HER2: Not Assessed) - Signed by Nicholas Lose, MD on 09/22/2021 ?Stage prefix: Initial diagnosis ? ?  ?09/29/2021 Surgery  ? 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  ?12/01/2021 - 12/29/2021 Radiation Therapy  ? Site Technique Total Dose (Gy) Dose per Fx (Gy) Completed Fx Beam Energies  ?Breast, Left: Breast_L 3D 40.05/40.05 2.67 15/15 10X, 15X  ?Breast, Left: Breast_L_Bst 3D 10/10 2 5/5 6X, 10X  ?  ? ?  ? ? ?INTERVAL HISTORY:  ?[name] to review her survivorship care plan detailing her treatment course for breast cancer, as well as monitoring long-term side effects of that treatment, education regarding health maintenance, screening, and overall wellness and health promotion.    ? ?Overall, [name] reports feeling moderately well.  She notes that she has right sided mid to lower back pain that has been present, however has been worsening recently.  She is concerned she may need further imaging or intervention and is not entirely please with her PCP approach.  She has some hot flashes with the tamoxifen and wants to know if there are any ways to decrease the hot flashes she experiences.   ? ?REVIEW OF SYSTEMS:  ?Review of Systems  ?Constitutional:  Negative for appetite change, chills, fatigue, fever and  unexpected weight change.  ?HENT:   Negative for hearing loss, lump/mass and trouble swallowing.   ?Eyes:  Negative for eye problems and icterus.  ?Respiratory:  Negative for chest tightness, cough and shortness of breath.   ?Cardiovascular:  Negative for chest pain, leg swelling and palpitations.  ?Gastrointestinal:  Negative for abdominal distention, abdominal pain, constipation, diarrhea, nausea and vomiting.  ?Endocrine: Positive for hot flashes.  ?Genitourinary:  Negative for difficulty urinating.   ?Musculoskeletal:  Positive for back pain. Negative for arthralgias.  ?Skin:  Negative for itching and rash.  ?Neurological:  Negative for dizziness, extremity weakness, headaches and numbness.  ?Hematological:  Negative for adenopathy. Does not bruise/bleed easily.  ?Psychiatric/Behavioral:  Negative for depression. The patient is not nervous/anxious.   ?Breast: Denies any new nodularity, masses, tenderness, nipple changes, or nipple discharge.  ? ? ?ONCOLOGY TREATMENT TEAM:  ?1. Surgeon:  Dr. [name] at [facility] ?2. Medical Oncologist: Dr. Lindi Adie  ?3. Radiation Oncologist: Dr. Isidore Moos ?  ? ?PAST MEDICAL/SURGICAL HISTORY:  ?Past Medical History:  ?Diagnosis Date  ? Breast cancer ([place])   ? Diabetes mellitus without complication ([place])   ? Hypertension   ? Thyroid disease   ? ?Past Surgical History:  ?Procedure Laterality Date  ? APPENDECTOMY    ? BREAST CYST EXCISION Left 09/29/2021  ? Procedure: LEFT CENTRAL BREAST EXCISION;  Surgeon: Donnie Mesa, MD;  Location: [place];  Service: General;  Laterality: Left;  ? BREAST LUMPECTOMY WITH RADIOACTIVE SEED LOCALIZATION Left 09/29/2021  ? Procedure: LEFT BREAST  LUMPECTOMY WITH RADIOACTIVE SEED LOCALIZATION;  Surgeon: Donnie Mesa, MD;  Location: [place];  Service: General;  Laterality: Left;  ? CESAREAN SECTION    ? RE-EXCISION OF BREAST LUMPECTOMY Left 10/27/2021  ? Procedure: RE-EXCISION OFSUPERIOR MARGIN  LEFT LUMPECTOMY SITE;  Surgeon: Donnie Mesa, MD;   Location: [place];  Service: General;  Laterality: Left;  LMA  ? THYROIDECTOMY, PARTIAL    ? TUBAL LIGATION    ? ? ? ?ALLERGIES:  ?No Known Allergies ? ? ?CURRENT MEDICATIONS:  ?Outpatient Encounter Medications as of 03/22/2022  ?Medication Sig  ? acetaminophen (TYLENOL) 500 MG tablet Take 1,500 mg by mouth every 6 (six) hours as needed (pain.).  ? albuterol (VENTOLIN HFA) 108 (90 Base) MCG/ACT inhaler Inhale 2 puffs into the lungs every 6 (six) hours as needed for wheezing or shortness of breath.  ? amLODipine (NORVASC) 5 MG tablet Take 5 mg by mouth in the morning.  ? Cholecalciferol (VITAMIN D3) 50 MCG (2000 UT) TABS Take 2,000 Units by mouth in the morning.  ? HYDROcodone-acetaminophen (NORCO/VICODIN) 5-325 MG tablet Take 1 tablet by mouth every 6 (six) hours as needed for moderate pain.  ? levothyroxine (SYNTHROID) 137 MCG tablet Take 137 mcg by mouth daily before breakfast.  ? meloxicam (MOBIC) 15 MG tablet Take 1 tablet (15 mg total) by mouth daily. Use as needed for back pain (Patient taking differently: Take 15 mg by mouth daily as needed for pain.)  ? tamoxifen (NOLVADEX) 10 MG tablet Take 1 tablet (10 mg total) by mouth daily.  ? TRULICITY 3.08 MV/7.8IO SOPN Inject 0.75 mg into the skin every Wednesday.  ? gabapentin (NEURONTIN) 100 MG capsule Take 100 mg by mouth 2 (two) times daily as needed for pain. (Patient not taking: Reported on 03/22/2022)  ? ondansetron (ZOFRAN) 8 MG tablet Take 1 tablet (8 mg total) by mouth every 8 (eight) hours as needed for nausea or vomiting. (Patient not taking: Reported on 03/22/2022)  ? promethazine (PHENERGAN) 25 MG tablet Take 1 tablet (25 mg total) by mouth every 6 (six) hours as needed for nausea or vomiting. (Patient not taking: Reported on 03/22/2022)  ? ?No facility-administered encounter medications on file as of 03/22/2022.  ? ? ? ?ONCOLOGIC FAMILY HISTORY:  ?Non-contributory ? ? ?GENETIC COUNSELING/TESTING: ?See above ? ?SOCIAL HISTORY:  ?Social History   ? ?Socioeconomic History  ? Marital status: Married  ?  Spouse name: Not on file  ? Number of children: Not on file  ? Years of education: Not on file  ? Highest education level: Not on file  ?Occupational History  ? Not on file  ?Tobacco Use  ? Smoking status: Never  ? Smokeless tobacco: Never  ?Vaping Use  ? Vaping Use: Never used  ?Substance and Sexual Activity  ? Alcohol use: No  ? Drug use: No  ? Sexual activity: Not on file  ?Other Topics Concern  ? Not on file  ?Social History Narrative  ? Not on file  ? ?Social Determinants of Health  ? ?Financial Resource Strain: Low Risk   ? Difficulty of Paying Living Expenses: Not very hard  ?Food Insecurity: No Food Insecurity  ? Worried About Charity fundraiser in the Last Year: Never true  ? Ran Out of Food in the Last Year: Never true  ?Transportation Needs: No Transportation Needs  ? Lack of Transportation (Medical): No  ? Lack of Transportation (Non-Medical): No  ?Physical Activity: Not on file  ?Stress: Not on file  ?Social Connections:  Not on file  ?Intimate Partner Violence: Not on file  ? ? ? ?OBSERVATIONS/OBJECTIVE:  ?BP 139/85 (BP Location: Left Arm, Patient Position: Sitting)   Pulse 90   Temp (!) 97.5 ?F (36.4 ?C) (Temporal)   Resp 18   Ht '5\' 8"'  (1.727 m)   Wt 266 lb 1.6 oz (120.7 kg)   LMP 12/01/2016   SpO2 98%   BMI 40.46 kg/m?  ?GENERAL: Patient is a well appearing female in no acute distress ?HEENT:  Sclerae anicteric.  Oropharynx clear and moist. No ulcerations or evidence of oropharyngeal candidiasis. Neck is supple.  ?NODES:  No cervical, supraclavicular, or axillary lymphadenopathy palpated.  ?BREAST EXAM: Left breast status postlumpectomy and radiation no sign of local recurrence.  Right breast is benign ?LUNGS:  Clear to auscultation bilaterally.  No wheezes or rhonchi. ?HEART:  Regular rate and rhythm. No murmur appreciated. ?ABDOMEN:  Soft, nontender.  Positive, normoactive bowel sounds. No organomegaly palpated. ?MSK:  No focal spinal  tenderness to palpation. Full range of motion bilaterally in the upper extremities. ?EXTREMITIES:  No peripheral edema.   ?SKIN:  Clear with no obvious rashes or skin changes. No nail dyscrasia. ?NEURO:  Nonfocal. Well oriented.

## 2022-03-24 ENCOUNTER — Ambulatory Visit (INDEPENDENT_AMBULATORY_CARE_PROVIDER_SITE_OTHER): Payer: Managed Care, Other (non HMO)

## 2022-03-24 ENCOUNTER — Ambulatory Visit (INDEPENDENT_AMBULATORY_CARE_PROVIDER_SITE_OTHER): Payer: Managed Care, Other (non HMO) | Admitting: Family Medicine

## 2022-03-24 VITALS — BP 128/84 | HR 94 | Ht 68.0 in | Wt 260.0 lb

## 2022-03-24 DIAGNOSIS — M546 Pain in thoracic spine: Secondary | ICD-10-CM

## 2022-03-24 DIAGNOSIS — R0782 Intercostal pain: Secondary | ICD-10-CM | POA: Diagnosis not present

## 2022-03-24 DIAGNOSIS — G8929 Other chronic pain: Secondary | ICD-10-CM

## 2022-03-24 NOTE — Progress Notes (Signed)
? ? ?Subjective:   ? ?CC: R-sided back pain ? ?I, Frances Cunningham, am serving as a scribe for Dr. Lynne Cunningham. ? ?HPI: Pt is a 61 y/o female presenting w/ c/o R-sided back pain x2 years but progressively getting worse.  She locates her pain to Mid right sided back pain.  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 Patient states that the pain is a constant sore pain that can make it hard for her to get out of bed. ? ?Radiating pain: no ?Aggravating factors: laying down on back or right side, trying to get out of bed or a chair,  ?Treatments tried: tramadol, hydrocodone, extra strength tylenol, tylenol arthritis, heating pad, TENS unit, Meloxicam ? ?Pertinent review of Systems: No fevers or chills ? ?Relevant historical information: DCIS ? ? ?Objective:   ? ?Vitals:  ? 03/24/22 1415  ?BP: 128/84  ?Pulse: 94  ?SpO2: 96%  ? ?General: Well Developed, well nourished, and in no acute distress.  ? ?MSK: T L-spine: Nontender midline.  Tender palpation right lumbar paraspinal musculature and right inferior posterior inferior rib margin tenderness. ?Decreased lumbar motion. ?Lower extremity strength is intact. ? ?Lab and Radiology Results ?No results found for this or any previous visit (from the past 72 hour(s)). ?DG Thoracic Spine 2 View ? ?Result Date: 03/24/2022 ?CLINICAL DATA:  Back pain EXAM: THORACIC SPINE 2 VIEWS COMPARISON:  None. FINDINGS: There is no evidence of thoracic spine fracture. Slight S shaped curvature of the thoracolumbar spine. No spondylolisthesis. Mild to moderate intervertebral disc space narrowing throughout the thoracic spine. No other significant bone abnormalities are identified. IMPRESSION: Degenerative changes with no acute fracture identified. Electronically Signed   By: Ofilia Neas M.D.   On: 03/24/2022 15:26  ? ?DG Lumbar Spine 2-3 Views ? ?Result Date: 03/24/2022 ?CLINICAL DATA:  Low back pain EXAM: LUMBAR SPINE - 2-3 VIEW COMPARISON:  None. FINDINGS: Lumbar vertebral body  height and alignment are preserved without fracture or spondylolisthesis. No suspicious bony lesion identified. Facet arthropathy most significant from L4-S1. Mild to moderate intervertebral disc space narrowing from L3-S1. IMPRESSION: Degenerative changes with no acute fracture identified. Electronically Signed   By: Ofilia Neas M.D.   On: 03/24/2022 15:27   ?I, Frances Cunningham, personally (independently) visualized and performed the interpretation of the images attached in this note. ? ?X-ray images right ribs obtained today personally and independently interpreted ? ?Inferior rib ribs are difficult to visualize due to body habitus.  No acute fractures are visible per my read ?Await formal radiology review ? ?Impression and Recommendations:   ? ?Assessment and Plan: ?61 y.o. female with right thoracic back pain.  This is a chronic issue that has failed to improve despite medication management with her primary care provider.  She is a good candidate for trial of PT.  We will plan for conventional physical therapy and if that does not work aquatic therapy.  X-rays obtained today especially given her cancer history will be helpful.  If not improving with PT consider MRI or CT scan.. ? ?PDMP not reviewed this encounter. ?Orders Placed This Encounter  ?Procedures  ? DG Lumbar Spine 2-3 Views  ?  Standing Status:   Future  ?  Number of Occurrences:   1  ?  Standing Expiration Date:   03/25/2023  ?  Order Specific Question:   Reason for Exam (SYMPTOM  OR DIAGNOSIS REQUIRED)  ?  Answer:   Mid/Low back pain  ?  Order Specific Question:  Preferred imaging location?  ?  Answer:   [facility]  ? DG Thoracic Spine 2 View  ?  Standing Status:   Future  ?  Number of Occurrences:   1  ?  Standing Expiration Date:   03/25/2023  ?  Order Specific Question:   Reason for Exam (SYMPTOM  OR DIAGNOSIS REQUIRED)  ?  Answer:   Mid/low back pain  ?  Order Specific Question:   Preferred imaging location?  ?  Answer:   [facility]  ? DG Ribs Unilateral W/Chest Right  ?  Standing Status:   Future  ?  Number of Occurrences:   1  ?  Standing Expiration Date:   03/25/2023  ?  Order Specific Question:   Reason for Exam (SYMPTOM  OR DIAGNOSIS REQUIRED)  ?  Answer:   right sidded chest pain  ?  Order Specific Question:   Preferred imaging location?  ?  Answer:   [facility]  ? Ambulatory referral to Physical Therapy  ?  Referral Priority:   Routine  ?  Referral Type:   Physical Medicine  ?  Referral Reason:   Specialty Services Required  ?  Requested Specialty:   Physical Therapy  ?  Number of Visits Requested:   1  ? ?No orders of the defined types were placed in this encounter. ? ? ?Discussed warning signs or symptoms. Please see discharge instructions. Patient expresses understanding. ? ? ?The above documentation has been reviewed and is accurate and complete Frances Cunningham, M.D. ? ?

## 2022-03-24 NOTE — Patient Instructions (Addendum)
Good to see you ?Get X-rays on your way out ?PT [facility] placed ?Follow up in 6 weeks  ?

## 2022-03-25 NOTE — Progress Notes (Signed)
Lumbar spine x-ray shows arthritis changes

## 2022-03-25 NOTE — Progress Notes (Signed)
Thoracic spine x-ray shows some arthritis changes.  No fractures are visible.

## 2022-03-28 NOTE — Progress Notes (Signed)
Rib x-ray looks normal to radiology

## 2022-04-08 ENCOUNTER — Ambulatory Visit (INDEPENDENT_AMBULATORY_CARE_PROVIDER_SITE_OTHER): Payer: Managed Care, Other (non HMO) | Admitting: Rehabilitative and Restorative Service Providers"

## 2022-04-08 ENCOUNTER — Encounter: Payer: Self-pay | Admitting: Rehabilitative and Restorative Service Providers"

## 2022-04-08 DIAGNOSIS — R262 Difficulty in walking, not elsewhere classified: Secondary | ICD-10-CM | POA: Diagnosis not present

## 2022-04-08 DIAGNOSIS — M6281 Muscle weakness (generalized): Secondary | ICD-10-CM | POA: Diagnosis not present

## 2022-04-08 DIAGNOSIS — R293 Abnormal posture: Secondary | ICD-10-CM

## 2022-04-08 DIAGNOSIS — M5459 Other low back pain: Secondary | ICD-10-CM

## 2022-04-08 NOTE — Therapy (Addendum)
OUTPATIENT PHYSICAL THERAPY THORACOLUMBAR EVALUATION   Patient Name: Frances Cunningham MRN: [id number] DOB:17-Sep-1961, 61 y.o., female Today's Date: 04/08/2022  PHYSICAL THERAPY DISCHARGE SUMMARY  Visits from Start of Care: Eval only  Current functional level related to goals / functional outcomes: See note   Remaining deficits: See note   Education / Equipment: Starter HEP   Patient agrees to discharge. Patient goals were  established but this patient has not followed-up since the evaluation . Patient is being discharged due to not returning since the last visit.    PT End of Session - 04/08/22 1125     Visit Number 1    Number of Visits 16    Progress Note Due on Visit 10    PT Start Time 8891    PT Stop Time 1015    PT Time Calculation (min) 44 min    Activity Tolerance Patient limited by pain    Behavior During Therapy WFL for tasks assessed/performed             Past Medical History:  Diagnosis Date   Breast cancer ([place])    Diabetes mellitus without complication ([place])    Hypertension    Thyroid disease    Past Surgical History:  Procedure Laterality Date   APPENDECTOMY     BREAST CYST EXCISION Left 09/29/2021   Procedure: LEFT CENTRAL BREAST EXCISION;  Surgeon: Donnie Mesa, MD;  Location: [place];  Service: General;  Laterality: Left;   BREAST LUMPECTOMY WITH RADIOACTIVE SEED LOCALIZATION Left 09/29/2021   Procedure: LEFT BREAST LUMPECTOMY WITH RADIOACTIVE SEED LOCALIZATION;  Surgeon: Donnie Mesa, MD;  Location: [place];  Service: General;  Laterality: Left;   CESAREAN SECTION     RE-EXCISION OF BREAST LUMPECTOMY Left 10/27/2021   Procedure: RE-EXCISION OFSUPERIOR MARGIN  LEFT LUMPECTOMY SITE;  Surgeon: Donnie Mesa, MD;  Location: [place];  Service: General;  Laterality: Left;  LMA   THYROIDECTOMY, PARTIAL     TUBAL LIGATION     Patient Active Problem List   Diagnosis Date Noted   Genetic testing 10/01/2021   Ductal carcinoma in situ (DCIS) of left  breast 09/17/2021   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   Graves disease 06/18/2014   Morbid obesity ([place]) 04/09/2013   Essential hypertension, benign 04/09/2013    PCP: Located at [facility] PROVIDER: Gregor Hams, MD  REFERRING DIAG:  [phone] (ICD-10-CM) - Chronic right-sided thoracic back pain  R07.82 (ICD-10-CM) - Intercostal pain    THERAPY DIAG:  Difficulty in walking, not elsewhere classified - Plan: PT plan of care cert/re-cert  Abnormal posture - Plan: PT plan of care cert/re-cert  Other low back pain - Plan: PT plan of care cert/re-cert  Muscle weakness (generalized) - Plan: PT plan of care cert/re-cert  ONSET DATE: Gradually worsening over the past few years.  SUBJECTIVE:  SUBJECTIVE STATEMENT: Roopa notes deep back pain in her lower thoracic and upper lumbar spine.  R sided pain.  It is worst with sitting, bad with walking and slightly less limiting with standing.  Her symptoms have been present for some time but have become severe recently.    PERTINENT HISTORY:  Breast cancer, diabetes, HTN, thyroid disease, obesity  PAIN:  Are you having pain? Yes: NPRS scale: 4-10/10 Pain location: R sided thoracic/lumbar Pain description: Deep Aggravating factors: Prolonged sitting, lying on R side, getting up in the morning, prolonged standing Relieving factors: Pain medication   PRECAUTIONS: Back  WEIGHT BEARING RESTRICTIONS No  FALLS:  Has patient fallen in last 6 months? No  PLOF: Independent  PATIENT GOALS Be able to function without debilitating pain/spasm.   OBJECTIVE:   DIAGNOSTIC FINDINGS:  Relatively normal rib, thoracic and lumbar X-Ray  PATIENT SURVEYS:  FOTO 34 (Goal 51)  SCREENING FOR  RED FLAGS: Bowel or bladder incontinence: No Spinal tumors: No Cauda equina syndrome: No Compression fracture: No Abdominal aneurysm: No  COGNITION:  Overall cognitive status: Within functional limits for tasks assessed     SENSATION: No complaints of tingling or numbness  POSTURE:  Epiphany is unable to assume an ideal upright posture due to R thoracolumbar muscle pain/guarding/spasm   LUMBAR ROM:   Active  A/PROM  04/08/2022  Flexion   Extension 0  Right lateral flexion 10  Left lateral flexion 15  Right rotation   Left rotation    (Blank rows = not tested)  LE ROM:  Passive  Right 04/08/2022 Left 04/08/2022  Hip flexion    Hip extension    Hip abduction    Hip adduction    Hip internal rotation    Hip external rotation    Knee flexion    Knee extension    Ankle dorsiflexion    Ankle plantarflexion    Ankle inversion    Ankle eversion     (Blank rows = not tested)  LE MMT:  MMT Right 04/08/2022 Left 04/08/2022  Hip flexion    Hip extension    Hip abduction    Hip adduction    Hip internal rotation    Hip external rotation    Knee flexion    Knee extension    Ankle dorsiflexion    Ankle plantarflexion    Ankle inversion    Ankle eversion     (Blank rows = not tested)  GAIT: Distance walked: Short distance Assistive device utilized: None Level of assistance: Complete Independence Comments: Sandar is limited to short distances due to increasing R sided thoracolumbar pain and spasm    TODAY'S TREATMENT  Therapeutic Exercises: Lumbar extension AROM (very limited range) 10X 3 seconds Gentle shoulder blade pinches 10X 5 seconds Hip hike in doorway 3X 2 seconds B (Hold, too early) Heel to toe raises with transversus abdominus bracing 10X 3 seconds Sit to stand (higher surface) 10X slow eccentrics with emphasis on maintaining lumbar lordosis  Functional Activities: Review exam findings, postural basics, imaging review, log roll technique for bed  mobility, beginner dry needling education   PATIENT EDUCATION:  Education details: See above Person educated: Patient Education method: Explanation, Demonstration, Tactile cues, Verbal cues, and Handouts Education comprehension: verbalized understanding, returned demonstration, verbal cues required, tactile cues required, and needs further education   HOME EXERCISE PROGRAM: Access Code: [email or web address] URL: [email or web address] Date: 04/08/2022 Prepared by: Vista Mink  Exercises - Heel Toe Raises with Counter Support  - 3-5 x daily -  7 x weekly - 1 sets - 10 reps - 3 seconds hold - Sit to Stand with Armchair  - 3-5 x daily - 7 x weekly - 1 sets - 5 reps  ASSESSMENT:  CLINICAL IMPRESSION: Patient is a 61 y.o. female who was seen today for physical therapy evaluation and treatment for R sided back pain.  Kasy has some significant pain and spasm that prevents her from maintaining a normal, upright posture.  We tried several interventions to find 2 activities for her to work on at home to relax spasm and correct posture.  We also discussed dry needling as an option to help address localized pain and spasm and complement her active PT program.   OBJECTIVE IMPAIRMENTS Abnormal gait, decreased activity tolerance, decreased endurance, decreased knowledge of condition, difficulty walking, decreased ROM, decreased strength, decreased safety awareness, increased edema, increased fascial restrictions, impaired perceived functional ability, increased muscle spasms, impaired flexibility, improper body mechanics, postural dysfunction, obesity, and pain.   ACTIVITY LIMITATIONS cleaning, community activity, and meal prep.   PERSONAL FACTORS Breast cancer, diabetes, HTN, thyroid disease, obesity are also affecting patient's functional outcome.    REHAB POTENTIAL: Good  CLINICAL DECISION MAKING: Stable/uncomplicated  EVALUATION COMPLEXITY: Low   GOALS: Goals reviewed with patient?  Yes  SHORT TERM GOALS: Target date: 05/06/2022    (Remove Blue Hyperlink)  Takita will be able to comfortably complete all 5 activities tried at evaluation 04/08/2022. Baseline: Able to do 2/5 with minor modifications. Goal status: INITIAL  2.  Manna will improve trunk AROM for extension to 10 degrees and lateral bending to 20 degrees. Baseline: 0 and 10/15, respectively Goal status: INITIAL   LONG TERM GOALS: Target date: 06/03/2022  (Remove Blue Hyperlink)  Improve back pain to consistently 0-3/10 on the VAS. Baseline: 4-10/10 Goal status: INITIAL  2.  Improve FOTO to 51. Baseline: 34 Goal status: INITIAL  3.  Zaya will be able to walk distances up to and beyond a 1/2 mile without being stopped by pain. Baseline: Limited to short household and community distances by pain Goal status: INITIAL  4.  [place] will be independent with her long-term HEP at DC. Baseline: Started 04/08/2022 Goal status: INITIAL   PLAN: PT FREQUENCY: 1-2x/week  PT DURATION: 8 weeks  PLANNED INTERVENTIONS: Therapeutic exercises, Therapeutic activity, Neuromuscular re-education, Gait training, Patient/Family education, Joint mobilization, Dry Needling, Electrical stimulation, Spinal mobilization, Cryotherapy, Moist heat, and Manual therapy.  PLAN FOR NEXT SESSION: Dry Needle candidate?  Postural and body mechanics education.  Review and progress HEP as appropriate (very pain limited at evaluation).   Farley Ly, PT, MPT 04/08/2022, 11:47 AM

## 2022-04-28 ENCOUNTER — Encounter: Payer: Managed Care, Other (non HMO) | Admitting: Rehabilitative and Restorative Service Providers"

## 2022-04-28 NOTE — Therapy (Deleted)
OUTPATIENT PHYSICAL THERAPY TREATMENT NOTE   Patient Name: Frances Cunningham MRN: [id number] DOB:Sep 29, 1961, 61 y.o., female Today's Date: 04/28/2022  PCP: Marland Kitchen REFERRING PROVIDER: ***  END OF SESSION:    Past Medical History:  Diagnosis Date   Breast cancer ([place])    Diabetes mellitus without complication ([place])    Hypertension    Thyroid disease    Past Surgical History:  Procedure Laterality Date   APPENDECTOMY     BREAST CYST EXCISION Left 09/29/2021   Procedure: LEFT CENTRAL BREAST EXCISION;  Surgeon: Donnie Mesa, MD;  Location: [place];  Service: General;  Laterality: Left;   BREAST LUMPECTOMY WITH RADIOACTIVE SEED LOCALIZATION Left 09/29/2021   Procedure: LEFT BREAST LUMPECTOMY WITH RADIOACTIVE SEED LOCALIZATION;  Surgeon: Donnie Mesa, MD;  Location: [place];  Service: General;  Laterality: Left;   CESAREAN SECTION     RE-EXCISION OF BREAST LUMPECTOMY Left 10/27/2021   Procedure: RE-EXCISION OFSUPERIOR MARGIN  LEFT LUMPECTOMY SITE;  Surgeon: Donnie Mesa, MD;  Location: [place];  Service: General;  Laterality: Left;  LMA   THYROIDECTOMY, PARTIAL     TUBAL LIGATION     Patient Active Problem List   Diagnosis Date Noted   Genetic testing 10/01/2021   Ductal carcinoma in situ (DCIS) of left breast 09/17/2021   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  Rationale for Evaluation and Treatment Rehabilitation  PERTINENT HISTORY: Breast cancer, diabetes, HTN, thyroid disease, obesity  PRECAUTIONS: Back  SUBJECTIVE: ***  PAIN:  Are you having pain? {[id number]}   OBJECTIVE: (objective measures completed at initial evaluation unless otherwise dated)  OBJECTIVE:     DIAGNOSTIC FINDINGS:  Relatively normal rib, thoracic and lumbar X-Ray   PATIENT SURVEYS:  FOTO 34 (Goal 51)   SCREENING FOR RED FLAGS: Bowel or bladder incontinence: No Spinal tumors: No Cauda equina syndrome: No Compression fracture: No Abdominal aneurysm: No   COGNITION:           Overall cognitive status: Within functional limits for tasks assessed                          SENSATION: No complaints of tingling or numbness   POSTURE:  Frances Cunningham is unable to assume an ideal upright posture due to R thoracolumbar muscle pain/guarding/spasm     LUMBAR ROM:    Active  A/PROM  04/08/2022  Flexion    Extension 0  Right lateral flexion 10  Left lateral flexion 15  Right rotation    Left rotation     (Blank rows = not tested)   LE ROM:   Passive  Right 04/08/2022 Left 04/08/2022  Hip flexion      Hip extension      Hip abduction      Hip adduction      Hip internal rotation      Hip external rotation      Knee flexion      Knee extension      Ankle dorsiflexion      Ankle plantarflexion      Ankle inversion      Ankle eversion       (Blank rows = not tested)   LE MMT:   MMT  Right 04/08/2022 Left 04/08/2022  Hip flexion      Hip extension      Hip abduction      Hip adduction      Hip internal rotation      Hip external rotation      Knee flexion      Knee extension      Ankle dorsiflexion      Ankle plantarflexion      Ankle inversion      Ankle eversion       (Blank rows = not tested)   GAIT: Distance walked: Short distance Assistive device utilized: None Level of assistance: Complete Independence Comments: Frances Cunningham is limited to short distances due to increasing R sided thoracolumbar pain and spasm       TODAY'S TREATMENT  Therapeutic Exercises: Lumbar extension AROM (very limited range) 10X 3 seconds Gentle shoulder blade pinches 10X 5 seconds Hip hike in doorway 3X 2 seconds B (Hold, too early) Heel to toe raises with transversus abdominus  bracing 10X 3 seconds Sit to stand (higher surface) 10X slow eccentrics with emphasis on maintaining lumbar lordosis   Functional Activities: Review exam findings, postural basics, imaging review, log roll technique for bed mobility, beginner dry needling education     PATIENT EDUCATION:  Education details: See above Person educated: Patient Education method: Explanation, Demonstration, Tactile cues, Verbal cues, and Handouts Education comprehension: verbalized understanding, returned demonstration, verbal cues required, tactile cues required, and needs further education     HOME EXERCISE PROGRAM: Access Code: [email or web address] URL: [email or web address] Date: 04/08/2022 Prepared by: Vista Mink   Exercises - Heel Toe Raises with Counter Support  - 3-5 x daily - 7 x weekly - 1 sets - 10 reps - 3 seconds hold - Sit to Stand with Armchair  - 3-5 x daily - 7 x weekly - 1 sets - 5 reps   ASSESSMENT:   CLINICAL IMPRESSION: Patient is a 61 y.o. female who was seen today for physical therapy evaluation and treatment for R sided back pain.  Frances Cunningham has some significant pain and spasm that prevents her from maintaining a normal, upright posture.  We tried several interventions to find 2 activities for her to work on at home to relax spasm and correct posture.  We also discussed dry needling as an option to help address localized pain and spasm and complement her active PT program.     OBJECTIVE IMPAIRMENTS Abnormal gait, decreased activity tolerance, decreased endurance, decreased knowledge of condition, difficulty walking, decreased ROM, decreased strength, decreased safety awareness, increased edema, increased fascial restrictions, impaired perceived functional ability, increased muscle spasms, impaired flexibility, improper body mechanics, postural dysfunction, obesity, and pain.    ACTIVITY LIMITATIONS cleaning, community activity, and meal prep.    PERSONAL FACTORS Breast cancer,  diabetes, HTN, thyroid disease, obesity are also affecting patient's functional outcome.      REHAB POTENTIAL: Good   CLINICAL DECISION MAKING: Stable/uncomplicated   EVALUATION COMPLEXITY: Low     GOALS: Goals reviewed with patient? Yes   SHORT TERM GOALS: Target date: 05/06/2022    (Remove Blue Hyperlink)   [place] will be able to comfortably complete all 5 activities tried at evaluation 04/08/2022. Baseline: Able to do 2/5 with minor modifications. Goal status: INITIAL   2.  [place] will improve trunk AROM for extension to 10 degrees and lateral bending to 20 degrees. Baseline: 0 and 10/15, respectively Goal status: INITIAL     LONG TERM GOALS: Target date: 06/03/2022  [facility]  Hyperlink)   Improve back pain to consistently 0-3/10 on the VAS. Baseline: 4-10/10 Goal status: INITIAL   2.  Improve FOTO to 51. Baseline: 34 Goal status: INITIAL   3.  Krissa will be able to walk distances up to and beyond a 1/2 mile without being stopped by pain. Baseline: Limited to short household and community distances by pain Goal status: INITIAL   4.  [place] will be independent with her long-term HEP at [place]. Baseline: Started 04/08/2022 Goal status: INITIAL     PLAN: PT FREQUENCY: 1-2x/week   PT DURATION: 8 weeks   PLANNED INTERVENTIONS: Therapeutic exercises, Therapeutic activity, Neuromuscular re-education, Gait training, Patient/Family education, Joint mobilization, Dry Needling, Electrical stimulation, Spinal mobilization, Cryotherapy, Moist heat, and Manual therapy.   PLAN FOR NEXT SESSION: Dry Needle candidate?  Postural and body mechanics education.  Review and progress HEP as appropriate (very pain limited at evaluation).      Farley Ly, PT, MPT 04/28/2022, 8:06 AM

## 2022-05-05 ENCOUNTER — Encounter: Payer: Self-pay | Admitting: Family Medicine

## 2022-05-05 ENCOUNTER — Ambulatory Visit (INDEPENDENT_AMBULATORY_CARE_PROVIDER_SITE_OTHER): Payer: Managed Care, Other (non HMO) | Admitting: Family Medicine

## 2022-05-05 VITALS — BP 120/84 | HR 86 | Ht 68.0 in | Wt 262.4 lb

## 2022-05-05 DIAGNOSIS — M545 Low back pain, unspecified: Secondary | ICD-10-CM | POA: Diagnosis not present

## 2022-05-05 DIAGNOSIS — M546 Pain in thoracic spine: Secondary | ICD-10-CM | POA: Diagnosis not present

## 2022-05-05 DIAGNOSIS — G8929 Other chronic pain: Secondary | ICD-10-CM | POA: Diagnosis not present

## 2022-05-05 DIAGNOSIS — R0782 Intercostal pain: Secondary | ICD-10-CM | POA: Diagnosis not present

## 2022-05-05 MED ORDER — LORAZEPAM 0.5 MG PO TABS: ORAL_TABLET | ORAL | 0 refills | Status: DC

## 2022-05-05 NOTE — Progress Notes (Signed)
I, Frances Cunningham, LAT, ATC, am serving as scribe for Dr. [name].  Frances Cunningham is a 61 y.o. female who presents to [place] at [facility] today for f/u R-sided thoracic back pain. Pt was last seen by Dr. Georgina Snell on 03/24/22 and was referred to PT, but only completed the initial evaluation. Today, pt reports that PT is too painful and decided not to con't w/ that treatment.  She has been taking Tramadol and hydrocodone and Tylenol that helps some but overall, she notes no change in her symptoms.  Dx imaging: 03/24/22 Thoracic & Lumbar spine XR and R rib/chest XR  Pertinent review of systems: No fevers or chills  Relevant historical information: History of breast cancer ductal carcinoma in situ.  History of Graves' disease and hypertension.   Exam:  BP 120/84 (BP Location: Right Arm, Patient Position: Sitting, Cuff Size: Large)   Pulse 86   Ht '5\' 8"'$  (1.727 m)   Wt 262 lb 6.4 oz (119 kg)   LMP 12/01/2016   SpO2 94%   BMI 39.90 kg/m  General: Well Developed, well nourished, and in no acute distress.   MSK: Spine: TL junction: Tender palpation right paraspinal musculature. Decreased spine motion in this region. Lower extremity strength is intact.    Lab and Radiology Results   DG Ribs Unilateral W/Chest Right  Result Date: 03/25/2022 CLINICAL DATA:  Posterior right lower rib pain for 1 year EXAM: RIGHT RIBS AND CHEST - 3+ VIEW COMPARISON:  10/16/2015 FINDINGS: Frontal view of the chest as well as frontal and oblique views of the right thoracic cage are obtained. Cardiac silhouette is unremarkable. No airspace disease, effusion, or pneumothorax. No acute displaced fracture. IMPRESSION: 1. No acute intrathoracic process.  No displaced rib fracture. Electronically Signed   By: Randa Ngo M.D.   On: 03/25/2022 11:55   DG Lumbar Spine 2-3 Views  Result Date: 03/24/2022 CLINICAL DATA:  Low back pain EXAM: LUMBAR SPINE - 2-3 VIEW COMPARISON:  None. FINDINGS:  Lumbar vertebral body height and alignment are preserved without fracture or spondylolisthesis. No suspicious bony lesion identified. Facet arthropathy most significant from L4-S1. Mild to moderate intervertebral disc space narrowing from L3-S1. IMPRESSION: Degenerative changes with no acute fracture identified. Electronically Signed   By: Ofilia Neas M.D.   On: 03/24/2022 15:27   DG Thoracic Spine 2 View  Result Date: 03/24/2022 CLINICAL DATA:  Back pain EXAM: THORACIC SPINE 2 VIEWS COMPARISON:  None. FINDINGS: There is no evidence of thoracic spine fracture. Slight S shaped curvature of the thoracolumbar spine. No spondylolisthesis. Mild to moderate intervertebral disc space narrowing throughout the thoracic spine. No other significant bone abnormalities are identified. IMPRESSION: Degenerative changes with no acute fracture identified. Electronically Signed   By: Ofilia Neas M.D.   On: 03/24/2022 15:26   I, [name], personally (independently) visualized and performed the interpretation of the images attached in this note.     Assessment and Plan: 61 y.o. female right right low back pain.  Pain is located at the thoracolumbar junction region on the left side.  She is failing to improve despite a good trial of conservative management so far.  Plan to proceed to further imaging with MRI lumbar spine.  This is her best bet to evaluate the area more accurately.  MRI should shows facet joints and for potential facet injection planning.  If MRI is nondiagnostic thoracic spine MRI is probably the next best bet followed by a CT scan of  the abdomen and pelvis with contrast. Consider further physical therapy based on the results of the MRI.  Also consider facet injections as above.   PDMP reviewed during this encounter. Orders Placed This Encounter  Procedures   MR Lumbar Spine Wo Contrast    Include lower Tspine if able.    Standing Status:   Future    Standing Expiration Date:    05/06/2023    Order Specific Question:   What is the patient's sedation requirement?    Answer:   Anti-anxiety    Order Specific Question:   Does the patient have a pacemaker or implanted devices?    Answer:   No    Order Specific Question:   Preferred imaging location?    Answer:   [facility] (table limit-350lbs)   Meds ordered this encounter  Medications   LORazepam (ATIVAN) 0.5 MG tablet    Sig: 1-2 tabs 30 - 60 min prior to MRI. Do not drive with this medicine.    Dispense:  4 tablet    Refill:  0     Discussed warning signs or symptoms. Please see discharge instructions. Patient expresses understanding.   The above documentation has been reviewed and is accurate and complete [name], M.D.

## 2022-05-05 NOTE — Patient Instructions (Addendum)
Good to see you today.  I've ordered an MRI.  That facility will call you to schedule but please let us know if you don't hear from [place] within one week regarding scheduling.  Follow-up: after MRI

## 2022-05-10 ENCOUNTER — Ambulatory Visit (INDEPENDENT_AMBULATORY_CARE_PROVIDER_SITE_OTHER): Payer: Managed Care, Other (non HMO)

## 2022-05-10 DIAGNOSIS — G8929 Other chronic pain: Secondary | ICD-10-CM | POA: Diagnosis not present

## 2022-05-10 DIAGNOSIS — M545 Low back pain, unspecified: Secondary | ICD-10-CM | POA: Diagnosis not present

## 2022-05-10 DIAGNOSIS — R0782 Intercostal pain: Secondary | ICD-10-CM

## 2022-05-12 NOTE — Progress Notes (Signed)
Lumbar spine MRI shows a bulging disc at T11-T12 that could cause some pain on the side where you hurt.  Additionally you have some facet joint arthritis that could cause some pain.  We could proceed with injections.  If you would like me to go ahead and order the injections now I can however I think it would be a good idea for [place] to talk about the MRI and the injection planning in full detail.  Recommend scheduling an appointment with me in the near future.

## 2022-05-13 ENCOUNTER — Ambulatory Visit (INDEPENDENT_AMBULATORY_CARE_PROVIDER_SITE_OTHER): Payer: Managed Care, Other (non HMO) | Admitting: Family Medicine

## 2022-05-13 VITALS — BP 130/78 | HR 94 | Ht 68.0 in | Wt 262.8 lb

## 2022-05-13 DIAGNOSIS — M545 Low back pain, unspecified: Secondary | ICD-10-CM

## 2022-05-13 DIAGNOSIS — M47816 Spondylosis without myelopathy or radiculopathy, lumbar region: Secondary | ICD-10-CM

## 2022-05-13 DIAGNOSIS — G8929 Other chronic pain: Secondary | ICD-10-CM

## 2022-05-13 NOTE — Progress Notes (Signed)
I, Peterson Lombard, LAT, ATC acting as a scribe for [name], MD.  Frances Cunningham is a 61 y.o. female who presents to [place] at [facility] today for follow-up right thoracic and lumbar pain as well as review MRI.  Frances Cunningham has a chronic history of right flank/side pain at the thoracolumbar region.  During her last visit on June 8 an MRI was ordered of the lumbar spine for potential facet injection planning with a potential plan to proceed to possible MRI T-spine and then a potential CT scan abdomen and pelvis if lumbar spine MRI was nondiagnostic. In the interim she notes pain has continued. Frances Cunningham notes she has not taken any of her pain meds today.  Dx imaging: 05/10/22 L-spine MRI  03/24/22 R ribs/chest, T-spine, & L-spine XR  Pertinent review of systems: No fevers or chills  Relevant historical information: Breast cancer history.  Hypertension.  Graves' disease.   Exam:  BP 130/78   Pulse 94   Ht '5\' 8"'$  (1.727 m)   Wt 262 lb 12.8 oz (119.2 kg)   LMP 12/01/2016   SpO2 98%   BMI 39.96 kg/m  General: Well Developed, well nourished, and in no acute distress.   MSK: T and L-spine: Tender palpation right lumbar thoracic junction area.  Decreased lumbar motion.     Lab and Radiology Results \ EXAM: MRI LUMBAR SPINE WITHOUT CONTRAST   TECHNIQUE: Multiplanar, multisequence MR imaging of the lumbar spine was performed. No intravenous contrast was administered.   COMPARISON:  Lumbar radiographs April 27, 23.   FINDINGS: Segmentation: Standard segmentation is assumed. The inferior-most fully formed intervertebral disc labeled L5-S1.   Alignment:  Vertebral body heights are maintained.   Vertebrae: Degenerative/discogenic endplate signal changes about the right eccentric T11-T12 disc. Otherwise, no focal marrow edema to suggest acute fracture discitis/osteomyelitis. No suspicious bone lesions.   Conus medullaris and cauda equina: Conus extends to the L1  level. Conus appears normal.   Paraspinal and other soft tissues: Unremarkable.   Disc levels:   T11-T12: Right eccentric degenerative/discogenic endplate signal changes at T11-T12 with associated disc height loss and disc bulging. Probably moderate right foraminal stenosis, only imaged sagittally. Probably mild left foraminal stenosis. No significant canal stenosis.   T12-L1: Small right subarticular disc protrusion. No significant canal or foraminal stenosis.   L1-L2: No significant disc protrusion, foraminal stenosis, or canal stenosis.   L2-L3: Mild disc bulging. Mild facet arthropathy. No significant canal or foraminal stenosis.   L3-L4: Mild disc bulging. Bilateral facet arthropathy. Bilateral perineural root sleeve cysts. No significant canal or foraminal stenosis.   L4-L5: Mild disc bulging. Moderate bilateral facet arthropathy. No significant canal or foraminal stenosis.   L5-S1: Moderate right and mild left facet arthropathy. No significant canal or foraminal stenosis.   IMPRESSION: 1. Right eccentric degenerative/discogenic endplate changes at D42-A76. Probably moderate right T11-T12 foraminal stenosis, only imaged sagittally. 2. Lower lumbar facet arthropathy without significant canal or foraminal stenosis in the lumbar spine.     Electronically Signed   By: Margaretha Sheffield M.D.   On: 05/11/2022 09:56 I, [name], personally (independently) visualized and performed the interpretation of the images attached in this note.     Assessment and Plan: 61 y.o. female with pain right low back area at the TL junction region.  Based on results of lumbar spine MRI pain thought primarily to be due to facet arthritis at L2-3 and L3-4.  Differential could include potential thoracic radiculopathy from the bulging  disc seen at T11-T12 that is partially visible on the lumbar MRI.  Plan for facet injection at L2-3 and L3-4.  If this is not effective would recommend  epidural steroid injection targeting T11-T12.  If this is not effective would recommend thoracic MRI followed by CT scan abdomen and pelvis. I reviewed the imaging and report of the lumbar spine MRI as well as the sequential treatment plan outlined above. Frances Cunningham expressed understanding and agreement. Total encounter time 30 minutes including face-to-face time with the patient and, reviewing past medical record, and charting on the date of service.     PDMP not reviewed this encounter. Orders Placed This Encounter  Procedures   DG FACET JT INJ L /S 2ND LEVEL RIGHT W/FL/CT    R L2-3 and L3-4    Standing Status:   Future    Standing Expiration Date:   06/12/2022    Order Specific Question:   Reason for Exam (SYMPTOM  OR DIAGNOSIS REQUIRED)    Answer:   lumbar radiculopathy    Order Specific Question:   Preferred Imaging Location?    Answer:   [id number]   DG FACET JT INJ L /S SINGLE LEVEL RIGHT W/FL/CT    R L2-3 and L3-4    Standing Status:   Future    Standing Expiration Date:   06/12/2022    Order Specific Question:   Reason for Exam (SYMPTOM  OR DIAGNOSIS REQUIRED)    Answer:   low back pain    Order Specific Question:   Preferred Imaging Location?    Answer:   [facility]   No orders of the defined types were placed in this encounter.    Discussed warning signs or symptoms. Please see discharge instructions. Patient expresses understanding.   The above documentation has been reviewed and is accurate and complete [name], M.D.

## 2022-05-13 NOTE — Patient Instructions (Addendum)
Thank you for coming in today.   Please call [facility] at [phone] to schedule your spine injection.    Let me know how it goes.      

## 2022-05-19 ENCOUNTER — Ambulatory Visit
Admission: RE | Admit: 2022-05-19 | Discharge: 2022-05-19 | Disposition: A | Discharge status: Home / Self Care | Payer: Managed Care, Other (non HMO) | Source: Ambulatory Visit | Attending: Family Medicine | Admitting: Family Medicine

## 2022-05-19 DIAGNOSIS — M545 Low back pain, unspecified: Secondary | ICD-10-CM

## 2022-05-19 MED ORDER — METHYLPREDNISOLONE ACETATE 40 MG/ML INJ SUSP (RADIOLOG
80.0000 mg | Freq: Once | INTRAMUSCULAR | Status: AC
  Administered 2022-05-19: 80 mg via INTRA_ARTICULAR

## 2022-05-19 MED ORDER — IOPAMIDOL (ISOVUE-M 200) INJECTION 41%
1.0000 mL | Freq: Once | INTRAMUSCULAR | Status: AC
  Administered 2022-05-19: 1 mL via INTRA_ARTICULAR

## 2022-05-19 NOTE — Discharge Instructions (Signed)

## 2022-06-27 NOTE — Progress Notes (Unsigned)
   I, Peterson Lombard, LAT, ATC acting as a scribe for [name], MD.  Bralynn Velador is a 61 y.o. female who presents to [place] at [facility] today for cont'd back pain thought primarily to be due to facet arthritis at L2-3 and L3-4. A partially visible bulging disc seen at T11-T12 that is  on the lumbar MRI. Pt was last seen by Dr. Georgina Snell on 05/13/22 for R-sided thoracic and lumbar pain and was referred for facet injection at L2-3 and L3-4, that were performed on 05/19/22. Today, pt reports  Dx imaging: 05/10/22 L-spine MRI             03/24/22 R ribs/chest, T-spine, & L-spine XR  Pertinent review of systems: ***  Relevant historical information: ***   Exam:  LMP 12/01/2016  General: Well Developed, well nourished, and in no acute distress.   MSK: ***    Lab and Radiology Results No results found for this or any previous visit (from the past 72 hour(s)). No results found.     Assessment and Plan: 61 y.o. female with ***   PDMP not reviewed this encounter. No orders of the defined types were placed in this encounter.  No orders of the defined types were placed in this encounter.    Discussed warning signs or symptoms. Please see discharge instructions. Patient expresses understanding.   ***

## 2022-06-28 ENCOUNTER — Ambulatory Visit (INDEPENDENT_AMBULATORY_CARE_PROVIDER_SITE_OTHER): Payer: Managed Care, Other (non HMO) | Admitting: Family Medicine

## 2022-06-28 VITALS — BP 130/84 | HR 87 | Ht 68.0 in | Wt 260.0 lb

## 2022-06-28 DIAGNOSIS — M545 Low back pain, unspecified: Secondary | ICD-10-CM

## 2022-06-28 DIAGNOSIS — G8929 Other chronic pain: Secondary | ICD-10-CM

## 2022-06-28 DIAGNOSIS — M47816 Spondylosis without myelopathy or radiculopathy, lumbar region: Secondary | ICD-10-CM | POA: Diagnosis not present

## 2022-06-28 NOTE — Patient Instructions (Addendum)
Good to see you   Please call [facility] at [phone] to schedule your spine injection.    Let me know how it goes after the Medial branch block and ablation.

## 2022-07-05 ENCOUNTER — Ambulatory Visit
Admission: RE | Admit: 2022-07-05 | Discharge: 2022-07-05 | Disposition: A | Discharge status: Home / Self Care | Payer: Managed Care, Other (non HMO) | Source: Ambulatory Visit | Attending: Family Medicine | Admitting: Family Medicine

## 2022-07-05 ENCOUNTER — Other Ambulatory Visit: Payer: Self-pay | Admitting: Family Medicine

## 2022-07-05 DIAGNOSIS — M47816 Spondylosis without myelopathy or radiculopathy, lumbar region: Secondary | ICD-10-CM

## 2022-07-05 DIAGNOSIS — G8929 Other chronic pain: Secondary | ICD-10-CM

## 2022-07-05 DIAGNOSIS — M545 Low back pain, unspecified: Secondary | ICD-10-CM

## 2022-07-05 NOTE — Discharge Instructions (Signed)

## 2022-07-06 ENCOUNTER — Encounter: Payer: Self-pay | Admitting: Family Medicine

## 2022-07-12 ENCOUNTER — Ambulatory Visit: Payer: Managed Care, Other (non HMO) | Admitting: Family Medicine

## 2022-07-13 ENCOUNTER — Ambulatory Visit: Payer: Managed Care, Other (non HMO) | Admitting: Family Medicine

## 2022-07-13 VITALS — BP 152/88 | HR 86 | Ht 68.0 in | Wt 263.0 lb

## 2022-07-13 DIAGNOSIS — M546 Pain in thoracic spine: Secondary | ICD-10-CM

## 2022-07-13 DIAGNOSIS — G8929 Other chronic pain: Secondary | ICD-10-CM

## 2022-07-13 MED ORDER — LORAZEPAM 0.5 MG PO TABS: ORAL_TABLET | ORAL | 0 refills | Status: DC

## 2022-07-13 NOTE — Progress Notes (Signed)
I, Peterson Lombard, LAT, ATC acting as a scribe for Frances Leader, MD.  Frances Cunningham is a 61 y.o. female who presents to [place] at [facility] today for cont'd back pain thought primarily to be due to facet arthritis at L2-3 and L3-4. A partially visible bulging disc seen at T11-T12 that is  on the lumbar MRI. Pt was last seen by Dr. Georgina Snell on 06/28/22 and a medial branch block and ablation facet joints right L3-4 and L2-3 was ordered. Today, pt reports low back pain is about the same. Pt notes no relief from facet injections on 07/05/22.   Dx imaging: 05/10/22 L-spine MRI             03/24/22 R ribs/chest, T-spine, & L-spine XR  Pertinent review of systems: No fevers or chills  Relevant historical information: History of breast cancer.  DCIS.   Exam:  BP (!) 152/88   Pulse 86   Ht '5\' 8"'$  (1.727 m)   Wt 263 lb (119.3 kg)   LMP 12/01/2016   SpO2 97%   BMI 39.99 kg/m  General: Well Developed, well nourished, and in no acute distress.   MSK: T and L-spine: Nontender midline.  Tender palpation right thoracolumbar junction paraspinal musculature. Decreased thoracic and lumbar motion.    Lab and Radiology Results   EXAM: MRI LUMBAR SPINE WITHOUT CONTRAST   TECHNIQUE: Multiplanar, multisequence MR imaging of the lumbar spine was performed. No intravenous contrast was administered.   COMPARISON:  Lumbar radiographs April 27, 23.   FINDINGS: Segmentation: Standard segmentation is assumed. The inferior-most fully formed intervertebral disc labeled L5-S1.   Alignment:  Vertebral body heights are maintained.   Vertebrae: Degenerative/discogenic endplate signal changes about the right eccentric T11-T12 disc. Otherwise, no focal marrow edema to suggest acute fracture discitis/osteomyelitis. No suspicious bone lesions.   Conus medullaris and cauda equina: Conus extends to the L1 level. Conus appears normal.   Paraspinal and other soft tissues: Unremarkable.    Disc levels:   T11-T12: Right eccentric degenerative/discogenic endplate signal changes at T11-T12 with associated disc height loss and disc bulging. Probably moderate right foraminal stenosis, only imaged sagittally. Probably mild left foraminal stenosis. No significant canal stenosis.   T12-L1: Small right subarticular disc protrusion. No significant canal or foraminal stenosis.   L1-L2: No significant disc protrusion, foraminal stenosis, or canal stenosis.   L2-L3: Mild disc bulging. Mild facet arthropathy. No significant canal or foraminal stenosis.   L3-L4: Mild disc bulging. Bilateral facet arthropathy. Bilateral perineural root sleeve cysts. No significant canal or foraminal stenosis.   L4-L5: Mild disc bulging. Moderate bilateral facet arthropathy. No significant canal or foraminal stenosis.   L5-S1: Moderate right and mild left facet arthropathy. No significant canal or foraminal stenosis.   IMPRESSION: 1. Right eccentric degenerative/discogenic endplate changes at Q65-H84. Probably moderate right T11-T12 foraminal stenosis, only imaged sagittally. 2. Lower lumbar facet arthropathy without significant canal or foraminal stenosis in the lumbar spine.     Electronically Signed   By: Margaretha Sheffield M.D.   On: 05/11/2022 09:56 I, Frances Cunningham, personally (independently) visualized and performed the interpretation of the images attached in this note.     Assessment and Plan: 62 y.o. female with chronic mid back pain.  Patient has failed conservative management including trials of physical therapy.  Recently she has had trials of facet injection and subsequent medial branch block targeting right L2 and L3.  At this point differential includes potential thoracic radiculopathy.  Her lumbar  MRI did show potential pinched nerve at right T11-T12 but this was not fully visualized with lumbar MRI.  This would potentially fit her pain pattern some.  Before we do any more  interventions proceed to thoracic MRI to further evaluate potential for thoracic radiculopathy and for thoracic epidural steroid injection planning.  Anticipate proceeding directly to thoracic epidural steroid injection based on the results of the T-spine MRI.  Additionally we spent time talking about chronic pain management.  We could try nonopiate based medications such as Cymbalta etc.  We also talked about self-guided cognitive behavioral therapy.  I recommended a book to consider.   PDMP not reviewed this encounter. Orders Placed This Encounter  Procedures   MR THORACIC SPINE WO CONTRAST    Standing Status:   Future    Standing Expiration Date:   07/14/2023    Order Specific Question:   What is the patient's sedation requirement?    Answer:   Anti-anxiety    Order Specific Question:   Does the patient have a pacemaker or implanted devices?    Answer:   No    Order Specific Question:   Preferred imaging location?    Answer:   Product/process development scientist (table limit-350lbs)   Meds ordered this encounter  Medications   LORazepam (ATIVAN) 0.5 MG tablet    Sig: 1-2 tabs 30 - 60 min prior to MRI. Do not drive with this medicine.    Dispense:  4 tablet    Refill:  0     Discussed warning signs or symptoms. Please see discharge instructions. Patient expresses understanding.   The above documentation has been reviewed and is accurate and complete Frances Cunningham, M.D.  Total encounter time 30 minutes including face-to-face time with the patient and, reviewing past medical record, and charting on the date of service.

## 2022-07-13 NOTE — Patient Instructions (Addendum)
Thank you for coming in today.   You should hear from MRI scheduling within 1 week. If you do not hear please let me know.    The Pain Management Workbook Dr Charlaine Dalton  Based on the MRI we likely will do another back injeciton

## 2022-07-18 ENCOUNTER — Ambulatory Visit (INDEPENDENT_AMBULATORY_CARE_PROVIDER_SITE_OTHER): Payer: Managed Care, Other (non HMO)

## 2022-07-18 DIAGNOSIS — M546 Pain in thoracic spine: Secondary | ICD-10-CM

## 2022-07-18 DIAGNOSIS — G8929 Other chronic pain: Secondary | ICD-10-CM | POA: Diagnosis not present

## 2022-07-21 ENCOUNTER — Telehealth: Payer: Self-pay | Admitting: Family Medicine

## 2022-07-21 DIAGNOSIS — G8929 Other chronic pain: Secondary | ICD-10-CM

## 2022-07-21 DIAGNOSIS — M5414 Radiculopathy, thoracic region: Secondary | ICD-10-CM

## 2022-07-21 NOTE — Telephone Encounter (Signed)
T-spine epidural steroid injection ordered.

## 2022-07-21 NOTE — Progress Notes (Signed)
Thoracic spine MRI does show a bulging disc pressing on the spinal cord at T7-8 level which could potentially cause her symptoms.   Additionally you have a bulging disc pressing on the nerve root on the right T11-12 which could cause some pain in this area as well.   Additionally you have medium facet arthritis at T11-12 which could cause similar pain. Your thoracic spine MRI was helpful because it gives [place] some targets for potential injections.  I am going to asked the radiologist to proceed with an epidural steroid injection. Please contact [facility] at [phone] to schedule.

## 2022-07-27 ENCOUNTER — Ambulatory Visit
Admission: RE | Admit: 2022-07-27 | Discharge: 2022-07-27 | Discharge status: Home / Self Care | Payer: Managed Care, Other (non HMO) | Source: Ambulatory Visit | Attending: Family Medicine | Admitting: Family Medicine

## 2022-07-27 DIAGNOSIS — G8929 Other chronic pain: Secondary | ICD-10-CM

## 2022-07-27 DIAGNOSIS — M5414 Radiculopathy, thoracic region: Secondary | ICD-10-CM

## 2022-07-27 MED ORDER — IOPAMIDOL (ISOVUE-M 300) INJECTION 61%
1.0000 mL | Freq: Once | INTRAMUSCULAR | Status: AC | PRN
  Administered 2022-07-27: 1 mL via EPIDURAL

## 2022-07-27 MED ORDER — TRIAMCINOLONE ACETONIDE 40 MG/ML IJ SUSP (RADIOLOGY)
60.0000 mg | Freq: Once | INTRAMUSCULAR | Status: AC
  Administered 2022-07-27: 60 mg via EPIDURAL

## 2022-07-27 NOTE — Discharge Instructions (Signed)

## 2022-08-18 ENCOUNTER — Telehealth: Payer: Self-pay | Admitting: Hematology and Oncology

## 2022-08-18 NOTE — Telephone Encounter (Signed)
Rescheduled appointment per provider [facility]. Left voicemail. 

## 2022-09-21 ENCOUNTER — Ambulatory Visit: Payer: Managed Care, Other (non HMO) | Admitting: Hematology and Oncology

## 2022-09-23 NOTE — Progress Notes (Signed)
Patient Care Team: [facility] [place] as PCP - General Donnie Mesa, MD as Consulting Physician (General Surgery) Nicholas Lose, MD as Consulting Physician (Hematology and Oncology) Eppie Gibson, MD as Attending Physician (Radiation Oncology)  DIAGNOSIS: No diagnosis found.  SUMMARY OF ONCOLOGIC HISTORY: Oncology History  Ductal carcinoma in situ (DCIS) of left breast  09/17/2021 Initial Diagnosis   Screening mammogram: indeterminate linear calcifications central to the nipple, 1 cm mass sub-areolar depth, and 0.7 cm mass at 1:00 in the left breast. Diagnostic mammogram and [place]: two 1 cm masses in the upper outer left breast and linear calcifications behind the nipple of the left breast.  Biopsy: DCIS with calcifications ER 100%, PR 40% early invasion cannot be ruled out   09/22/2021 Cancer Staging   Staging form: Breast, AJCC 8th Edition - Clinical: Stage 0 (cTis (DCIS), cN0, cM0, ER+, PR+, HER2: Not Assessed) - Signed by Nicholas Lose, MD on 09/22/2021 Stage prefix: Initial diagnosis   09/29/2021 Surgery   Left lumpectomy: 1.5 cm Intermediate grade DCIS involves the superior margin, ER 100%, PR 40%   10/01/2021 Genetic Testing   Ms. 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 Specifically, this variant is [id number].[id number].  Management Recommendations for individuals with a single MUTYH pathogenic variant:   Colon Cancer Screening: If an individual has a first-degree relative with colorectal cancer, screening should begin at age 67 or 6 years prior to the relative's age at diagnosis, whichever comes first and repeat colonoscopies every 5 years.   If an individual has no first-degree relatives with colorectal cancer, data is unclear if specialized screening is warranted. We advise these patients to follow their gastroenterologists recommendations.   12/01/2021 - 12/29/2021  Radiation Therapy   Site Technique Total Dose (Gy) Dose per Fx (Gy) Completed Fx Beam Energies  Breast, Left: Breast_L 3D 40.05/40.05 2.67 15/15 10X, 15X  Breast, Left: Breast_L_Bst 3D 10/10 2 5/5 6X, 10X       12/2021 -  Anti-estrogen oral therapy   Tamoxifen daily     CHIEF COMPLIANT: Follow-up of left breast DCIS  INTERVAL HISTORY: Frances Cunningham is a 61 y.o. with above-mentioned history of left breast DCIS. She presents to the clinic today for follow-up.     ALLERGIES:  has No Known Allergies.  MEDICATIONS:  Current Outpatient Medications  Medication Sig Dispense Refill   acetaminophen (TYLENOL) 500 MG tablet Take 1,500 mg by mouth every 6 (six) hours as needed (pain.).     albuterol (VENTOLIN HFA) 108 (90 Base) MCG/ACT inhaler Inhale 2 puffs into the lungs every 6 (six) hours as needed for wheezing or shortness of breath.     amLODipine (NORVASC) 5 MG tablet Take 5 mg by mouth in the morning.     Cholecalciferol (VITAMIN D3) 50 MCG (2000 UT) TABS Take 2,000 Units by mouth in the morning.     levothyroxine (SYNTHROID) 137 MCG tablet Take 137 mcg by mouth daily before breakfast.     LORazepam (ATIVAN) 0.5 MG tablet 1-2 tabs 30 - 60 min prior to MRI. Do not drive with this medicine. 4 tablet 0   ondansetron (ZOFRAN) 8 MG tablet Take 1 tablet (8 mg total) by mouth every 8 (eight) hours as needed for nausea or vomiting. (Patient not taking: Reported on 03/22/2022) 15 tablet 1   promethazine (PHENERGAN) 25 MG tablet Take 1 tablet (25 mg total) by mouth every 6 (six) hours as  needed for nausea or vomiting. 20 tablet 1   tamoxifen (NOLVADEX) 10 MG tablet Take 1 tablet (10 mg total) by mouth daily. 90 tablet 3   traMADol (ULTRAM) 50 MG tablet Take by mouth every 6 (six) hours as needed.     TRULICITY 3.53 IR/4.4RX SOPN Inject 0.75 mg into the skin every Wednesday.     No current facility-administered medications for this visit.    PHYSICAL EXAMINATION: ECOG PERFORMANCE STATUS: {CHL ONC  ECOG [phone]}  There were no vitals filed for this visit. There were no vitals filed for this visit.  BREAST:*** No palpable masses or nodules in either right or left breasts. No palpable axillary supraclavicular or infraclavicular adenopathy no breast tenderness or nipple discharge. (exam performed in the presence of a chaperone)  LABORATORY DATA:  I have reviewed the data as listed    Latest Ref Rng & Units 10/27/2021   10:18 AM 09/22/2021    8:55 AM 04/09/2013   11:37 AM  CMP  Glucose 70 - 99 mg/dL 144  150  99   BUN 8 - 23 mg/dL _0 Creatinine 0.44 - 1.00 mg/dL 1.17  1.02  1.08   Sodium 135 - 145 mmol/L 136  141  142   Potassium 3.5 - 5.1 mmol/L 4.4  3.8  4.0   Chloride 98 - 111 mmol/L 106  110  108   CO2 22 - 32 mmol/L _1 Calcium 8.9 - 10.3 mg/dL 8.7  8.8  8.7   Total Protein 6.5 - 8.1 g/dL  7.9  7.4   Total Bilirubin 0.3 - 1.2 mg/dL  0.7  0.6   Alkaline Phos 38 - 126 U/L  99  84   AST 15 - 41 U/L  17  16   ALT 0 - 44 U/L  21  15     Lab Results  Component Value Date   WBC 7.1 10/27/2021   HGB 14.5 10/27/2021   HCT 42.5 10/27/2021   MCV 89.9 10/27/2021   PLT 284 10/27/2021   NEUTROABS 5.0 09/22/2021    ASSESSMENT & PLAN:  No problem-specific Assessment & Plan notes found for this encounter.    No orders of the defined types were placed in this encounter.  The patient has a good understanding of the overall plan. she agrees with it. she will call with any problems that may develop before the next visit here. Total time spent: 30 mins including face to face time and time spent for planning, charting and co-ordination of care   Suzzette Righter, [place] 09/23/22    I Gardiner Coins am scribing for Dr. Lindi Adie  ***

## 2022-09-27 ENCOUNTER — Inpatient Hospital Stay: Payer: Managed Care, Other (non HMO) | Attending: Hematology and Oncology | Admitting: Hematology and Oncology

## 2022-09-27 ENCOUNTER — Other Ambulatory Visit: Payer: Self-pay

## 2022-09-27 DIAGNOSIS — R21 Rash and other nonspecific skin eruption: Secondary | ICD-10-CM | POA: Diagnosis not present

## 2022-09-27 DIAGNOSIS — N951 Menopausal and female climacteric states: Secondary | ICD-10-CM | POA: Diagnosis not present

## 2022-09-27 DIAGNOSIS — D0512 Intraductal carcinoma in situ of left breast: Secondary | ICD-10-CM | POA: Insufficient documentation

## 2022-09-27 DIAGNOSIS — Z79810 Long term (current) use of selective estrogen receptor modulators (SERMs): Secondary | ICD-10-CM | POA: Insufficient documentation

## 2022-09-27 DIAGNOSIS — R110 Nausea: Secondary | ICD-10-CM | POA: Diagnosis not present

## 2022-09-27 MED ORDER — MICONAZOLE NITRATE 2 % EX CREA: 1.0000 | TOPICAL_CREAM | Freq: Two times a day (BID) | CUTANEOUS | 0 refills | Status: DC

## 2022-09-27 NOTE — Assessment & Plan Note (Addendum)
09/29/2021:Left lumpectomy: 1.5 cm Intermediate grade DCIS involves the superior margin, ER 100%, PR 40% 10/13/21: Margin Excision: Benign  Treatment plan: 1.Adjuvant radiation 12/02/21- 12/28/21 2.followed by adjuvant antiestrogen therapy with tamoxifen 10 mg x5 years  Tamoxifen toxicities: 1.  Hot flashes 2. intermittent nausea  Rash on bilateral breasts worse on the left: It looks like it has raised edges and therefore I sent a prescription for miconazole ointment.  She will call us if it is not getting better.  Breast cancer surveillance: 1.  Breast exam 09/27/2022: Benign 2. mammogram and bone density have been ordered.  Return to clinic in 1 year for follow-up

## 2022-09-28 ENCOUNTER — Telehealth: Payer: Self-pay | Admitting: Hematology and Oncology

## 2022-09-28 NOTE — Telephone Encounter (Signed)
Scheduled appointment per 10/31 los. Left voicemail.

## 2022-10-12 ENCOUNTER — Encounter: Payer: Self-pay | Admitting: Hematology and Oncology

## 2022-10-13 ENCOUNTER — Other Ambulatory Visit: Payer: Self-pay | Admitting: Hematology and Oncology

## 2022-10-13 DIAGNOSIS — D0512 Intraductal carcinoma in situ of left breast: Secondary | ICD-10-CM

## 2022-10-13 MED ORDER — TRIAMCINOLONE ACETONIDE 0.5 % EX OINT: 1.0000 | TOPICAL_OINTMENT | Freq: Two times a day (BID) | CUTANEOUS | 0 refills | Status: DC

## 2022-10-13 NOTE — Progress Notes (Signed)
Patient cannot afford diagnostic mammograms and therefore I will order for screening mammograms to be done at [facility]. Rash on the breast: Ordered triamcinolone ointment for the itching.  She appears to be responding to miconazole cream.

## 2022-10-17 ENCOUNTER — Other Ambulatory Visit: Payer: Self-pay | Admitting: *Deleted

## 2022-10-17 NOTE — Progress Notes (Signed)
Received message from pt stating she has been quoted $600 by [facility] for a diagnostic mammogram.  Pt states she is not able to pay that amount.  RN reached out to Kopperl with [facility] who stated the amount is due to pt deductible.  RN educated pt on this and pt states she is still not able to afford diagnostic.  RN reviewed with MD and verbal orders received for pt to undergo Screening Mammogram.  Orders placed and faxed to [facility]. Pt educated and verbalized understanding.

## 2022-10-31 ENCOUNTER — Encounter: Payer: Self-pay | Admitting: Adult Health

## 2022-12-07 ENCOUNTER — Other Ambulatory Visit: Payer: Self-pay | Admitting: Hematology and Oncology

## 2023-01-08 ENCOUNTER — Other Ambulatory Visit: Payer: Self-pay | Admitting: Hematology and Oncology

## 2023-06-03 ENCOUNTER — Other Ambulatory Visit (HOSPITAL_COMMUNITY): Payer: Self-pay

## 2023-06-03 MED ORDER — TRULICITY 3 MG/0.5ML ~~LOC~~ SOAJ
3.0000 mg | SUBCUTANEOUS | 1 refills | Status: DC
  Filled 2023-06-03 – 2023-06-07 (×2): qty 2, 28d supply, fill #0

## 2023-06-07 ENCOUNTER — Other Ambulatory Visit (HOSPITAL_COMMUNITY): Payer: Self-pay

## 2023-06-08 ENCOUNTER — Other Ambulatory Visit (HOSPITAL_COMMUNITY): Payer: Self-pay

## 2023-06-09 ENCOUNTER — Other Ambulatory Visit (HOSPITAL_COMMUNITY): Payer: Self-pay

## 2023-06-12 ENCOUNTER — Other Ambulatory Visit (HOSPITAL_COMMUNITY): Payer: Self-pay

## 2023-06-14 ENCOUNTER — Other Ambulatory Visit (HOSPITAL_COMMUNITY): Payer: Self-pay

## 2023-06-15 ENCOUNTER — Other Ambulatory Visit (HOSPITAL_COMMUNITY): Payer: Self-pay

## 2023-07-12 ENCOUNTER — Other Ambulatory Visit (HOSPITAL_COMMUNITY): Payer: Self-pay

## 2023-07-22 ENCOUNTER — Other Ambulatory Visit (HOSPITAL_COMMUNITY): Payer: Self-pay

## 2023-09-28 ENCOUNTER — Inpatient Hospital Stay: Payer: Managed Care, Other (non HMO) | Attending: Hematology and Oncology | Admitting: Hematology and Oncology

## 2023-09-28 VITALS — BP 140/76 | HR 96 | Temp 97.5°F | Resp 18 | Ht 68.0 in | Wt 251.1 lb

## 2023-09-28 DIAGNOSIS — N951 Menopausal and female climacteric states: Secondary | ICD-10-CM | POA: Insufficient documentation

## 2023-09-28 DIAGNOSIS — R110 Nausea: Secondary | ICD-10-CM | POA: Diagnosis not present

## 2023-09-28 DIAGNOSIS — Z170 Estrogen receptor positive status [ER+]: Secondary | ICD-10-CM | POA: Diagnosis not present

## 2023-09-28 DIAGNOSIS — D0512 Intraductal carcinoma in situ of left breast: Secondary | ICD-10-CM | POA: Diagnosis not present

## 2023-09-28 MED ORDER — VENLAFAXINE HCL ER 37.5 MG PO CP24: 37.5000 mg | ORAL_CAPSULE | Freq: Every day | ORAL | 6 refills | Status: DC

## 2023-09-28 NOTE — Progress Notes (Signed)
Patient Care Team: [facility] as PCP - General Manus Rudd, MD as Consulting Physician (General Surgery) Serena Croissant, MD as Consulting Physician (Hematology and Oncology) Lonie Peak, MD as Attending Physician (Radiation Oncology)  DIAGNOSIS:  Encounter Diagnosis  Name Primary?   Ductal carcinoma in situ (DCIS) of left breast Yes    SUMMARY OF ONCOLOGIC HISTORY: Oncology History  Ductal carcinoma in situ (DCIS) of left breast  09/17/2021 Initial Diagnosis   Screening mammogram: indeterminate linear calcifications central to the nipple, 1 cm mass sub-areolar depth, and 0.7 cm mass at 1:00 in the left breast. Diagnostic mammogram and [place]: two 1 cm masses in the upper outer left breast and linear calcifications behind the nipple of the left breast.  Biopsy: DCIS with calcifications ER 100%, PR 40% early invasion cannot be ruled out   09/22/2021 Cancer Staging   Staging form: Breast, AJCC 8th Edition - Clinical: Stage 0 (cTis (DCIS), cN0, cM0, ER+, PR+, HER2: Not Assessed) - Signed by Serena Croissant, MD on 09/22/2021 Stage prefix: Initial diagnosis   09/29/2021 Surgery   Left lumpectomy: 1.5 cm Intermediate grade DCIS involves the superior margin, ER 100%, PR 40%   10/01/2021 Genetic Testing   Ms. 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 Specifically, this variant is [id number].[id number].  Management Recommendations for individuals with a single MUTYH pathogenic variant:   Colon Cancer Screening: If an individual has a first-degree relative with colorectal cancer, screening should begin at age 71 or 10 years prior to the relative's age at diagnosis, whichever comes first and repeat colonoscopies every 5 years.   If an individual has no first-degree relatives with colorectal cancer, data is unclear if specialized screening is warranted. We advise these patients to  follow their gastroenterologists recommendations.   12/01/2021 - 12/29/2021 Radiation Therapy   Site Technique Total Dose (Gy) Dose per Fx (Gy) Completed Fx Beam Energies  Breast, Left: Breast_L 3D 40.05/40.05 2.67 15/15 10X, 15X  Breast, Left: Breast_L_Bst 3D 10/10 2 5/5 6X, 10X       12/2021 -  Anti-estrogen oral therapy   Tamoxifen daily     CHIEF COMPLIANT: Follow-up on tamoxifen therapy  History of Present Illness   The patient, with a history of breast cancer, is two years into a five-year course of tamoxifen. She reports severe hot flashes that are interfering with her quality of life. She also experiences mild nausea, but it is not as severe as it was initially. The patient is unsure of what she can take to alleviate the hot flashes. She is due for a mammogram in December and reports no pain or discomfort in her breasts. The patient is active, regularly attending a gym, and is aware of the benefits of exercise in reducing breast cancer risk.       ALLERGIES:  has No Known Allergies.  MEDICATIONS:  Current Outpatient Medications  Medication Sig Dispense Refill   venlafaxine XR (EFFEXOR-XR) 37.5 MG 24 hr capsule Take 1 capsule (37.5 mg total) by mouth daily with breakfast. 30 capsule 6   acetaminophen (TYLENOL) 500 MG tablet Take 1,500 mg by mouth every 6 (six) hours as needed (pain.).     albuterol (VENTOLIN HFA) 108 (90 Base) MCG/ACT inhaler Inhale 2 puffs into the lungs every 6 (six) hours as needed for wheezing or shortness of breath.     amLODipine (NORVASC) 5 MG tablet Take 5 mg by mouth  in the morning.     Cholecalciferol (VITAMIN D3) 50 MCG (2000 UT) TABS Take 2,000 Units by mouth in the morning.     Dulaglutide (TRULICITY) 3 MG/0.5ML SOPN Inject 3 mg into the skin once a week. 2 mL 1   levothyroxine (SYNTHROID) 137 MCG tablet Take 137 mcg by mouth daily before breakfast.     miconazole (MICATIN) 2 % cream Apply 1 Application topically 2 (two) times daily. 28.35 g 0    promethazine (PHENERGAN) 25 MG tablet Take 1 tablet (25 mg total) by mouth every 6 (six) hours as needed for nausea or vomiting. 20 tablet 1   tamoxifen (NOLVADEX) 10 MG tablet TAKE 1 TABLET(10 MG) BY MOUTH DAILY 90 tablet 3   traMADol (ULTRAM) 50 MG tablet Take by mouth every 6 (six) hours as needed.     triamcinolone ointment (KENALOG) 0.5 % Apply 1 Application topically 2 (two) times daily. 30 g 0   TRULICITY 0.75 MG/0.5ML SOPN Inject 0.75 mg into the skin every Wednesday.     No current facility-administered medications for this visit.    PHYSICAL EXAMINATION: ECOG PERFORMANCE STATUS: 1 - Symptomatic but completely ambulatory  Vitals:   09/28/23 1011  BP: (!) 140/76  Pulse: 96  Resp: 18  Temp: (!) 97.5 F (36.4 C)  SpO2: 98%   Filed Weights   09/28/23 1011  Weight: 251 lb 1.6 oz (113.9 kg)    Physical Exam   BREAST: Scar tissue normal, no lumps palpated.      (exam performed in the presence of a chaperone)  LABORATORY DATA:  I have reviewed the data as listed    Latest Ref Rng & Units 10/27/2021   10:18 AM 09/22/2021    8:55 AM 04/09/2013   11:37 AM  CMP  Glucose 70 - 99 mg/dL 401  027  99   BUN 8 - 23 mg/dL 13  17  12    Creatinine 0.44 - 1.00 mg/dL 2.53  6.64  4.03   Sodium 135 - 145 mmol/L 136  141  142   Potassium 3.5 - 5.1 mmol/L 4.4  3.8  4.0   Chloride 98 - 111 mmol/L 106  110  108   CO2 22 - 32 mmol/L 21  21  24    Calcium 8.9 - 10.3 mg/dL 8.7  8.8  8.7   Total Protein 6.5 - 8.1 g/dL  7.9  7.4   Total Bilirubin 0.3 - 1.2 mg/dL  0.7  0.6   Alkaline Phos 38 - 126 U/L  99  84   AST 15 - 41 U/L  17  16   ALT 0 - 44 U/L  21  15     Lab Results  Component Value Date   WBC 7.1 10/27/2021   HGB 14.5 10/27/2021   HCT 42.5 10/27/2021   MCV 89.9 10/27/2021   PLT 284 10/27/2021   NEUTROABS 5.0 09/22/2021    ASSESSMENT & PLAN:  Ductal carcinoma in situ (DCIS) of left breast 09/29/2021:Left lumpectomy: 1.5 cm Intermediate grade DCIS involves the superior  margin, ER 100%, 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  Adjuvant radiation 12/02/21- 12/28/21 2. followed by adjuvant antiestrogen therapy with tamoxifen 10 mg x5 years   Tamoxifen toxicities: 1.  Hot flashes: Extremely severe: I recommended starting her on Effexor 37.5 mg daily.  I sent the prescription today. 2. intermittent nausea   Rash on bilateral breasts worse on the left: It looks like it has raised  edges and therefore I sent a prescription for miconazole ointment.  She will call us if it is not getting better.   Breast cancer surveillance: 1.  Breast exam 09/28/2023: Benign 2. screening mammogram has been ordered (patient cannot afford diagnostic mammograms)   Return to clinic in 1 year for follow-up      No orders of the defined types were placed in this encounter.  The patient has a good understanding of the overall plan. she agrees with it. she will call with any problems that may develop before the next visit here. Total time spent: 30 mins including face to face time and time spent for planning, charting and co-ordination of care   Tamsen Meek, MD 09/28/23

## 2023-09-28 NOTE — Assessment & Plan Note (Addendum)
09/29/2021:Left lumpectomy: 1.5 cm 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  Adjuvant radiation 12/02/21- 12/28/21 2. followed by adjuvant antiestrogen therapy with tamoxifen 10 mg x5 years   Tamoxifen toxicities: 1.  Hot flashes: Extremely severe: I recommended starting her on Effexor 37.5 mg daily.  I sent the prescription today. 2. intermittent nausea   Rash on bilateral breasts worse on the left: It looks like it has raised edges and therefore I sent a prescription for miconazole ointment.  She will call us if it is not getting better.   Breast cancer surveillance: 1.  Breast exam 09/28/2023: Benign 2. screening mammogram has been ordered (patient cannot afford diagnostic mammograms)   Return to clinic in 1 year for follow-up

## 2023-11-01 ENCOUNTER — Encounter: Payer: Self-pay | Admitting: Adult Health

## 2024-02-14 ENCOUNTER — Other Ambulatory Visit: Payer: Self-pay | Admitting: Hematology and Oncology

## 2024-05-20 ENCOUNTER — Other Ambulatory Visit: Payer: Self-pay | Admitting: Hematology and Oncology

## 2024-06-25 ENCOUNTER — Other Ambulatory Visit: Payer: Self-pay | Admitting: Hematology and Oncology

## 2024-09-30 ENCOUNTER — Inpatient Hospital Stay: Payer: Managed Care, Other (non HMO) | Attending: Hematology and Oncology | Admitting: Hematology and Oncology

## 2024-09-30 VITALS — BP 131/86 | HR 108 | Temp 97.2°F | Resp 17 | Wt 233.5 lb

## 2024-09-30 DIAGNOSIS — Z79899 Other long term (current) drug therapy: Secondary | ICD-10-CM | POA: Diagnosis not present

## 2024-09-30 DIAGNOSIS — Z923 Personal history of irradiation: Secondary | ICD-10-CM | POA: Diagnosis not present

## 2024-09-30 DIAGNOSIS — Z79810 Long term (current) use of selective estrogen receptor modulators (SERMs): Secondary | ICD-10-CM | POA: Diagnosis not present

## 2024-09-30 DIAGNOSIS — Z170 Estrogen receptor positive status [ER+]: Secondary | ICD-10-CM | POA: Insufficient documentation

## 2024-09-30 DIAGNOSIS — D0512 Intraductal carcinoma in situ of left breast: Secondary | ICD-10-CM | POA: Insufficient documentation

## 2024-09-30 MED ORDER — VENLAFAXINE HCL ER 37.5 MG PO CP24: 37.5000 mg | ORAL_CAPSULE | Freq: Every day | ORAL | 3 refills | Status: AC

## 2024-09-30 MED ORDER — TAMOXIFEN CITRATE 10 MG PO TABS: 10.0000 mg | ORAL_TABLET | Freq: Every day | ORAL | 3 refills | Status: AC

## 2024-09-30 NOTE — Assessment & Plan Note (Signed)
 09/29/2021:Left lumpectomy: 1.5 cm 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  Adjuvant radiation 12/02/21- 12/28/21 2. followed by adjuvant antiestrogen therapy with tamoxifen  10 mg x5 years   Tamoxifen  toxicities: 1.  Hot flashes: Extremely severe: I recommended starting her on Effexor  37.5 mg daily.  I sent the prescription today. 2. intermittent nausea   Rash on bilateral breasts worse on the left: It looks like it has raised edges and therefore I sent a prescription for miconazole  ointment.  She will call us  if it is not getting better.   Breast cancer surveillance: 1.  Breast exam 09/30/2024: Benign 2. screening mammogram 10/30/2023 at [facility] (patient cannot afford diagnostic mammograms): Benign breast density category A   Return to clinic in 1 year for follow-up

## 2024-09-30 NOTE — Progress Notes (Signed)
 Patient Care Team: [facility] as PCP - General Belinda Cough, MD as Consulting Physician (General Surgery) Odean Potts, MD as Consulting Physician (Hematology and Oncology) Izell Domino, MD as Attending Physician (Radiation Oncology)  DIAGNOSIS:  Encounter Diagnosis  Name Primary?   Ductal carcinoma in situ (DCIS) of left breast Yes    SUMMARY OF ONCOLOGIC HISTORY: Oncology History  Ductal carcinoma in situ (DCIS) of left breast  09/17/2021 Initial Diagnosis   Screening mammogram: indeterminate linear calcifications central to the nipple, 1 cm mass sub-areolar depth, and 0.7 cm mass at 1:00 in the left breast. Diagnostic mammogram and US : two 1 cm masses in the upper outer left breast and linear calcifications behind the nipple of the left breast.  Biopsy: DCIS with calcifications ER 100%, PR 40% early invasion cannot be ruled out   09/22/2021 Cancer Staging   Staging form: Breast, AJCC 8th Edition - Clinical: Stage 0 (cTis (DCIS), cN0, cM0, ER+, PR+, HER2: Not Assessed) - Signed by Odean Potts, MD on 09/22/2021 Stage prefix: Initial diagnosis   09/29/2021 Surgery   Left lumpectomy: 1.5 cm Intermediate grade DCIS involves the superior margin, ER 100%, PR 40%   10/01/2021 Genetic Testing   Ms. 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 Specifically, this variant is [id number].  Management Recommendations for individuals with a single MUTYH pathogenic variant:   Colon Cancer Screening: If an individual has a first-degree relative with colorectal cancer, screening should begin at age 83 or 10 years prior to the relative's age at diagnosis, whichever comes first and repeat colonoscopies every 5 years.   If an individual has no first-degree relatives with colorectal cancer, data is unclear if specialized screening is warranted. We advise these patients to  follow their gastroenterologists recommendations.   12/01/2021 - 12/29/2021 Radiation Therapy   Site Technique Total Dose (Gy) Dose per Fx (Gy) Completed Fx Beam Energies  Breast, Left: Breast_L 3D 40.05/40.05 2.67 15/15 10X, 15X  Breast, Left: Breast_L_Bst 3D 10/10 2 5/5 6X, 10X       12/2021 -  Anti-estrogen oral therapy   Tamoxifen  daily     CHIEF COMPLIANT: Surveillance of breast cancer with history of DCIS on tamoxifen   HISTORY OF PRESENT ILLNESS:  History of Present Illness Frances Cunningham is a 63 year old female with ductal carcinoma in situ who presents for a follow-up visit regarding her breast cancer treatment and weight management.  She has been on Tamoxifen  for nearly three years for DCIS of the left breast, with no current breast pain or discomfort. Her previous breast rash has resolved with treatment.  She has lost approximately 30 pounds, now weighing 233 pounds, due to increased physical activity, including regular treadmill use.  She is taking Venlafaxine  XR 37.5 mg daily without significant side effects.     ALLERGIES:  has no known allergies.  MEDICATIONS:  Current Outpatient Medications  Medication Sig Dispense Refill   acetaminophen  (TYLENOL ) 500 MG tablet Take 1,500 mg by mouth every 6 (six) hours as needed (pain.).     albuterol (VENTOLIN HFA) 108 (90 Base) MCG/ACT inhaler Inhale 2 puffs into the lungs every 6 (six) hours as needed for wheezing or shortness of breath.     amLODipine (NORVASC) 5 MG tablet Take 5 mg by mouth in the morning.     Cholecalciferol (VITAMIN D3) 50 MCG (2000 UT) TABS Take 2,000 Units by mouth in the morning.  levothyroxine (SYNTHROID) 137 MCG tablet Take 137 mcg by mouth daily before breakfast.     miconazole  (MICATIN) 2 % cream Apply 1 Application topically 2 (two) times daily. 28.35 g 0   promethazine  (PHENERGAN ) 25 MG tablet Take 1 tablet (25 mg total) by mouth every 6 (six) hours as needed for nausea or vomiting. 20 tablet 1    tamoxifen  (NOLVADEX ) 10 MG tablet TAKE 1 TABLET(10 MG) BY MOUTH DAILY 90 tablet 0   traMADol  (ULTRAM ) 50 MG tablet Take by mouth every 6 (six) hours as needed.     triamcinolone  ointment (KENALOG ) 0.5 % Apply 1 Application topically 2 (two) times daily. 30 g 0   TRULICITY  0.75 MG/0.5ML SOPN Inject 0.75 mg into the skin every Wednesday.     venlafaxine  XR (EFFEXOR -XR) 37.5 MG 24 hr capsule TAKE 1 CAPSULE(37.5 MG) BY MOUTH DAILY WITH BREAKFAST 30 capsule 6   No current facility-administered medications for this visit.    PHYSICAL EXAMINATION: ECOG PERFORMANCE STATUS: 1 - Symptomatic but completely ambulatory  Vitals:   09/30/24 1001  BP: 131/86  Pulse: (!) 108  Resp: 17  Temp: (!) 97.2 F (36.2 C)  SpO2: 99%   Filed Weights   09/30/24 1001  Weight: 233 lb 8 oz (105.9 kg)    Physical Exam MEASUREMENTS: Weight- 233. Breast exam: No palpable lumps or nodules in bilateral breasts or axilla  (exam performed in the presence of a chaperone)  LABORATORY DATA:  I have reviewed the data as listed    Latest Ref Rng & Units 10/27/2021   10:18 AM 09/22/2021    8:55 AM 04/09/2013   11:37 AM  CMP  Glucose 70 - 99 mg/dL 855  849  99   BUN 8 - 23 mg/dL 13  17  12    Creatinine 0.44 - 1.00 mg/dL 8.82  8.97  8.91   Sodium 135 - 145 mmol/L 136  141  142   Potassium 3.5 - 5.1 mmol/L 4.4  3.8  4.0   Chloride 98 - 111 mmol/L 106  110  108   CO2 22 - 32 mmol/L 21  21  24    Calcium 8.9 - 10.3 mg/dL 8.7  8.8  8.7   Total Protein 6.5 - 8.1 g/dL  7.9  7.4   Total Bilirubin 0.3 - 1.2 mg/dL  0.7  0.6   Alkaline Phos 38 - 126 U/L  99  84   AST 15 - 41 U/L  17  16   ALT 0 - 44 U/L  21  15     Lab Results  Component Value Date   WBC 7.1 10/27/2021   HGB 14.5 10/27/2021   HCT 42.5 10/27/2021   MCV 89.9 10/27/2021   PLT 284 10/27/2021   NEUTROABS 5.0 09/22/2021    ASSESSMENT & PLAN:  Ductal carcinoma in situ (DCIS) of left breast 09/29/2021:Left lumpectomy: 1.5 cm Intermediate grade DCIS  involves the superior margin, ER 100%, 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  Adjuvant radiation 12/02/21- 12/28/21 2. followed by adjuvant antiestrogen therapy with tamoxifen  10 mg x5 years started February 2023   Tamoxifen  toxicities: 1.  Hot flashes: Extremely severe: I recommended starting her on Effexor  37.5 mg daily.  I sent the prescription today. 2. intermittent nausea     Breast cancer surveillance: 1.  Breast exam 09/30/2024: Benign 2. screening mammogram 10/30/2023 at [facility] (patient cannot afford diagnostic mammograms): Benign breast density category A   Weight: Patient lost about  35 pounds on Ozempic.  She is feeling a lot better.  Encouraged her to stay active and exercise 30 minutes a day.  She bought a treadmill recently.  Return to clinic in 1 year for follow-up      No orders of the defined types were placed in this encounter.  The patient has a good understanding of the overall plan. she agrees with it. she will call with any problems that may develop before the next visit here.  I personally spent a total of 30 minutes in the care of the patient today including preparing to see the patient, getting/reviewing separately obtained history, performing a medically appropriate exam/evaluation, counseling and educating, placing orders, referring and communicating with other health care professionals, documenting clinical information in the EHR, independently interpreting results, communicating results, and coordinating care.   Viinay K Jaymin Waln, MD 09/30/24

## 2024-11-18 ENCOUNTER — Encounter: Payer: Self-pay | Admitting: Hematology and Oncology

## 2025-09-30 ENCOUNTER — Inpatient Hospital Stay: Admitting: Hematology and Oncology
